# Patient Record
Sex: Male | Born: 1965 | Race: White | Hispanic: No | Marital: Married | Smoking: Former smoker
Health system: Southern US, Community
[De-identification: ages and names within clinical notes are randomized; demographics above are authoritative.]

## PROBLEM LIST (undated history)

## (undated) DIAGNOSIS — E7800 Pure hypercholesterolemia, unspecified: Secondary | ICD-10-CM

## (undated) DIAGNOSIS — E119 Type 2 diabetes mellitus without complications: Secondary | ICD-10-CM

## (undated) DIAGNOSIS — I10 Essential (primary) hypertension: Secondary | ICD-10-CM

## (undated) DIAGNOSIS — E669 Obesity, unspecified: Secondary | ICD-10-CM

## (undated) DIAGNOSIS — N289 Disorder of kidney and ureter, unspecified: Secondary | ICD-10-CM

## (undated) DIAGNOSIS — M549 Dorsalgia, unspecified: Secondary | ICD-10-CM

## (undated) HISTORY — PX: BACK SURGERY: SHX140

## (undated) HISTORY — PX: CHOLECYSTECTOMY: SHX55

## (undated) HISTORY — PX: OTHER SURGICAL HISTORY: SHX169

---

## 2018-11-29 ENCOUNTER — Emergency Department (HOSPITAL_BASED_OUTPATIENT_CLINIC_OR_DEPARTMENT_OTHER): Payer: BLUE CROSS/BLUE SHIELD

## 2018-11-29 ENCOUNTER — Emergency Department (HOSPITAL_BASED_OUTPATIENT_CLINIC_OR_DEPARTMENT_OTHER)
Admission: EM | Admit: 2018-11-29 | Discharge: 2018-11-29 | Disposition: A | Discharge status: Other Acute Inpatient Hospital | Payer: BLUE CROSS/BLUE SHIELD | Attending: Emergency Medicine | Admitting: Emergency Medicine

## 2018-11-29 ENCOUNTER — Other Ambulatory Visit: Payer: Self-pay

## 2018-11-29 ENCOUNTER — Encounter (HOSPITAL_BASED_OUTPATIENT_CLINIC_OR_DEPARTMENT_OTHER): Payer: Self-pay | Admitting: Emergency Medicine

## 2018-11-29 DIAGNOSIS — N179 Acute kidney failure, unspecified: Secondary | ICD-10-CM | POA: Diagnosis not present

## 2018-11-29 DIAGNOSIS — Z79899 Other long term (current) drug therapy: Secondary | ICD-10-CM | POA: Insufficient documentation

## 2018-11-29 DIAGNOSIS — E1122 Type 2 diabetes mellitus with diabetic chronic kidney disease: Secondary | ICD-10-CM | POA: Diagnosis not present

## 2018-11-29 DIAGNOSIS — I129 Hypertensive chronic kidney disease with stage 1 through stage 4 chronic kidney disease, or unspecified chronic kidney disease: Secondary | ICD-10-CM | POA: Insufficient documentation

## 2018-11-29 DIAGNOSIS — Z7902 Long term (current) use of antithrombotics/antiplatelets: Secondary | ICD-10-CM | POA: Insufficient documentation

## 2018-11-29 DIAGNOSIS — N184 Chronic kidney disease, stage 4 (severe): Secondary | ICD-10-CM | POA: Diagnosis not present

## 2018-11-29 DIAGNOSIS — Z7982 Long term (current) use of aspirin: Secondary | ICD-10-CM | POA: Insufficient documentation

## 2018-11-29 DIAGNOSIS — Z880 Allergy status to penicillin: Secondary | ICD-10-CM | POA: Diagnosis not present

## 2018-11-29 DIAGNOSIS — R109 Unspecified abdominal pain: Secondary | ICD-10-CM | POA: Diagnosis present

## 2018-11-29 DIAGNOSIS — Z87891 Personal history of nicotine dependence: Secondary | ICD-10-CM | POA: Diagnosis not present

## 2018-11-29 HISTORY — DX: Dorsalgia, unspecified: M54.9

## 2018-11-29 HISTORY — DX: Pure hypercholesterolemia, unspecified: E78.00

## 2018-11-29 HISTORY — DX: Obesity, unspecified: E66.9

## 2018-11-29 HISTORY — DX: Disorder of kidney and ureter, unspecified: N28.9

## 2018-11-29 HISTORY — DX: Type 2 diabetes mellitus without complications: E11.9

## 2018-11-29 HISTORY — DX: Essential (primary) hypertension: I10

## 2018-11-29 LAB — CBC WITH DIFFERENTIAL/PLATELET
Abs Immature Granulocytes: 0.02 10*3/uL (ref 0.00–0.07)
BASOS PCT: 1 %
Basophils Absolute: 0.1 10*3/uL (ref 0.0–0.1)
Eosinophils Absolute: 0.2 10*3/uL (ref 0.0–0.5)
Eosinophils Relative: 4 %
HCT: 34.9 % — ABNORMAL LOW (ref 39.0–52.0)
Hemoglobin: 11.3 g/dL — ABNORMAL LOW (ref 13.0–17.0)
Immature Granulocytes: 0 %
Lymphocytes Relative: 26 %
Lymphs Abs: 1.2 10*3/uL (ref 0.7–4.0)
MCH: 32 pg (ref 26.0–34.0)
MCHC: 32.4 g/dL (ref 30.0–36.0)
MCV: 98.9 fL (ref 80.0–100.0)
MONOS PCT: 8 %
Monocytes Absolute: 0.4 10*3/uL (ref 0.1–1.0)
Neutro Abs: 2.8 10*3/uL (ref 1.7–7.7)
Neutrophils Relative %: 61 %
PLATELETS: 198 10*3/uL (ref 150–400)
RBC: 3.53 MIL/uL — ABNORMAL LOW (ref 4.22–5.81)
RDW: 14.1 % (ref 11.5–15.5)
WBC: 4.6 10*3/uL (ref 4.0–10.5)
nRBC: 0 % (ref 0.0–0.2)

## 2018-11-29 LAB — BASIC METABOLIC PANEL
ANION GAP: 9 (ref 5–15)
BUN: 34 mg/dL — ABNORMAL HIGH (ref 6–20)
CALCIUM: 8.2 mg/dL — AB (ref 8.9–10.3)
CO2: 21 mmol/L — ABNORMAL LOW (ref 22–32)
Chloride: 102 mmol/L (ref 98–111)
Creatinine, Ser: 3.09 mg/dL — ABNORMAL HIGH (ref 0.61–1.24)
GFR calc Af Amer: 25 mL/min — ABNORMAL LOW (ref >60)
GFR calc non Af Amer: 22 mL/min — ABNORMAL LOW (ref >60)
Glucose, Bld: 241 mg/dL — ABNORMAL HIGH (ref 70–99)
Potassium: 3.9 mmol/L (ref 3.5–5.1)
Sodium: 132 mmol/L — ABNORMAL LOW (ref 135–145)

## 2018-11-29 LAB — COMPREHENSIVE METABOLIC PANEL
ALT: 64 U/L — ABNORMAL HIGH (ref 0–44)
AST: 50 U/L — ABNORMAL HIGH (ref 15–41)
Albumin: 3.5 g/dL (ref 3.5–5.0)
Alkaline Phosphatase: 141 U/L — ABNORMAL HIGH (ref 38–126)
Anion gap: 11 (ref 5–15)
BUN: 37 mg/dL — ABNORMAL HIGH (ref 6–20)
CO2: 22 mmol/L (ref 22–32)
Calcium: 8.9 mg/dL (ref 8.9–10.3)
Chloride: 100 mmol/L (ref 98–111)
Creatinine, Ser: 3.23 mg/dL — ABNORMAL HIGH (ref 0.61–1.24)
GFR calc non Af Amer: 21 mL/min — ABNORMAL LOW (ref >60)
GFR, EST AFRICAN AMERICAN: 24 mL/min — AB (ref >60)
Glucose, Bld: 372 mg/dL — ABNORMAL HIGH (ref 70–99)
Potassium: 4.8 mmol/L (ref 3.5–5.1)
Sodium: 133 mmol/L — ABNORMAL LOW (ref 135–145)
Total Bilirubin: 0.6 mg/dL (ref 0.3–1.2)
Total Protein: 7.2 g/dL (ref 6.5–8.1)

## 2018-11-29 LAB — URINALYSIS, MICROSCOPIC (REFLEX)

## 2018-11-29 LAB — URINALYSIS, ROUTINE W REFLEX MICROSCOPIC
BILIRUBIN URINE: NEGATIVE
Glucose, UA: 100 mg/dL — AB
Ketones, ur: NEGATIVE mg/dL
Leukocytes,Ua: NEGATIVE
Nitrite: NEGATIVE
Protein, ur: 100 mg/dL — AB
Specific Gravity, Urine: 1.01 (ref 1.005–1.030)
pH: 6 (ref 5.0–8.0)

## 2018-11-29 LAB — CK: Total CK: 21 U/L — ABNORMAL LOW (ref 49–397)

## 2018-11-29 MED ORDER — SODIUM CHLORIDE 0.9 % IV BOLUS
1000.0000 mL | Freq: Once | INTRAVENOUS | Status: AC
  Administered 2018-11-29: 1000 mL via INTRAVENOUS

## 2018-11-29 NOTE — ED Provider Notes (Signed)
[facility] EMERGENCY DEPARTMENT Provider Note   CSN: [id number] Arrival date & time: 11/29/18  1346    History   Chief Complaint Chief Complaint  Patient presents with  . Sent by pmd    HPI Jamie Pittman is a 52 y.o. male.     Patient recently evaluated at PCP office for chest tightness, occasional shortness of breath not related to activity, diffuse abdominal discomfort with occasional nausea. Emesis and diarrhea x 1 last week. No fevers/chills. Patient contacted yesterday by PCP with concern for abnormal lab values. History of CKD, DM, HTN. Reports blood sugar is fairly well controlled. Labs reviewed, mildly increased kidney functions. Liver enzymes noted to have increased between visit on 11/25/18 and 11/28/18. His gallbladder has been removed. Recent chest xray without acute abnormality. Flu A/B negative. Troponin on 11/25/18 within normal limits.   The history is provided by the patient and medical records. No language interpreter was used.  Abdominal Pain  Pain location:  Generalized Pain quality: gnawing   Pain radiates to:  Does not radiate Pain severity:  Mild Onset quality:  Gradual Timing:  Intermittent Progression:  Waxing and waning Chronicity:  New Associated symptoms: fatigue and nausea   Associated symptoms: no constipation     Past Medical History:  Diagnosis Date  . Back pain   . Diabetes mellitus without complication ([place])   . High cholesterol   . Hypertension   . Obesity   . Renal disorder     There are no active problems to display for this patient.   Past Surgical History:  Procedure Laterality Date  . BACK SURGERY    . CHOLECYSTECTOMY          Home Medications    Prior to Admission medications   Medication Sig Start Date End Date Taking? Authorizing Provider  aspirin EC 81 MG tablet Take by mouth. 02/17/17  Yes [provider]  ezetimibe (ZETIA) 10 MG tablet TAKE ONE (1) TABLET BY MOUTH EVERY DAY 10/12/18  Yes  [provider]  famotidine (PEPCID) 20 MG tablet Take by mouth. 11/25/18 12/25/18 Yes [provider]  metoprolol succinate (TOPROL-XL) 50 MG 24 hr tablet TAKE 1 AND 1/2 TABLETS BY MOUTH EVERY DAY 03/31/17  Yes [provider]  pantoprazole (PROTONIX) 40 MG tablet Take by mouth. 10/12/18 04/10/19 Yes [provider]  rosuvastatin (CRESTOR) 40 MG tablet TAKE ONE (1) TABLET BY MOUTH EVERY DAY 03/31/17  Yes [provider]  amLODipine (NORVASC) 10 MG tablet  11/15/18   [provider]  clopidogrel (PLAVIX) 75 MG tablet  11/01/18   [provider]  pioglitazone (ACTOS) 15 MG tablet  11/14/18   [provider]  [place] [phone] Base) MCG/ACT AEPB  11/24/18   [provider]    Family History No family history on file.  Social History Social History   Tobacco Use  . Smoking status: Former Research scientist (life sciences)  . Smokeless tobacco: Never Used  Substance Use Topics  . Alcohol use: Yes    Alcohol/week: 1.0 standard drinks    Types: 1 Shots of liquor per week    Comment: every other day  . Drug use: Never     Allergies   Penicillins   Review of Systems Review of Systems  Constitutional: Positive for fatigue.  Respiratory: Positive for chest tightness.   Gastrointestinal: Positive for abdominal pain and nausea. Negative for constipation.  All other systems reviewed and are negative.    Physical Exam Updated Vital  Signs BP 135/70 (BP Location: Right Arm)   Pulse 73   Temp 97.9 F (36.6 C) (Oral)   Resp 20   Ht 6\' 1"  (1.854 m)   Wt 124.7 kg   SpO2 100%   BMI 36.28 kg/m   Physical Exam Constitutional:      Appearance: Normal appearance.  HENT:     Head: Normocephalic.  Eyes:     Conjunctiva/sclera: Conjunctivae normal.  Cardiovascular:     Rate and Rhythm: Normal rate and regular rhythm.  Pulmonary:     Effort: Pulmonary effort is normal.     Breath sounds: Normal breath sounds.  Abdominal:      General: Bowel sounds are normal. There is no distension.     Palpations: Abdomen is soft.     Tenderness: There is no abdominal tenderness.  Musculoskeletal:     Right lower leg: No edema.     Left lower leg: No edema.  Skin:    General: Skin is warm and dry.     Coloration: Skin is pale.  Neurological:     Mental Status: He is alert and oriented to person, place, and time.  Psychiatric:        Mood and Affect: Mood normal.      ED Treatments / Results  Labs (all labs ordered are listed, but only abnormal results are displayed) Labs Reviewed  CBC WITH DIFFERENTIAL/PLATELET - Abnormal; Notable for the following components:      Result Value   RBC 3.53 (*)    Hemoglobin 11.3 (*)    HCT 34.9 (*)    All other components within normal limits  COMPREHENSIVE METABOLIC PANEL - Abnormal; Notable for the following components:   Sodium 133 (*)    Glucose, Bld 372 (*)    BUN 37 (*)    Creatinine, Ser 3.23 (*)    AST 50 (*)    ALT 64 (*)    Alkaline Phosphatase 141 (*)    GFR calc non Af Amer 21 (*)    GFR calc Af Amer 24 (*)    All other components within normal limits  URINALYSIS, ROUTINE W REFLEX MICROSCOPIC - Abnormal; Notable for the following components:   APPearance HAZY (*)    Glucose, UA 100 (*)    Hgb urine dipstick MODERATE (*)    Protein, ur 100 (*)    All other components within normal limits  URINALYSIS, MICROSCOPIC (REFLEX) - Abnormal; Notable for the following components:   Bacteria, UA MANY (*)    All other components within normal limits  BASIC METABOLIC PANEL - Abnormal; Notable for the following components:   Sodium 132 (*)    CO2 21 (*)    Glucose, Bld 241 (*)    BUN 34 (*)    Creatinine, Ser 3.09 (*)    Calcium 8.2 (*)    GFR calc non Af Amer 22 (*)    GFR calc Af Amer 25 (*)    All other components within normal limits  CK - Abnormal; Notable for the following components:   Total CK 21 (*)    All other components within normal limits    EKG  None  Radiology Ct Abdomen Pelvis Wo Contrast  Result Date: 11/29/2018 CLINICAL DATA:  Acute generalized abdominal pain. EXAM: CT ABDOMEN AND PELVIS WITHOUT CONTRAST TECHNIQUE: Multidetector CT imaging of the abdomen and pelvis was performed following the standard protocol without IV contrast. COMPARISON:  None. FINDINGS: Lower chest: No acute abnormality. Hepatobiliary: No focal  liver abnormality is seen. Status post cholecystectomy. No biliary dilatation. Pancreas: Unremarkable. No pancreatic ductal dilatation or surrounding inflammatory changes. Spleen: Normal in size without focal abnormality. Adrenals/Urinary Tract: Adrenal glands are unremarkable. Kidneys are normal, without renal calculi, focal lesion, or hydronephrosis. Bladder is unremarkable. Stomach/Bowel: Stomach is within normal limits. Appendix appears normal. No evidence of bowel wall thickening, distention, or inflammatory changes. Vascular/Lymphatic: Aortic atherosclerosis. No enlarged abdominal or pelvic lymph nodes. Reproductive: Prostate is unremarkable. Other: No abdominal wall hernia or abnormality. No abdominopelvic ascites. Musculoskeletal: No acute or significant osseous findings. IMPRESSION: No acute abnormality seen in the abdomen or pelvis. Electronically Signed   By: Marijo Conception, M.D.   On: 11/29/2018 16:59    Procedures Procedures (including critical care time)  Medications Ordered in ED Medications - No data to display   Initial Impression / Assessment and Plan / ED Course  I have reviewed the triage vital signs and the nursing notes.  Pertinent labs & imaging results that were available during my care of the patient were reviewed by me and considered in my medical decision making (see chart for details).       Patient with acute on chronic renal failure, unknown etiology. Admission last month for same, modification of medication regimen was made at that time, including cessation of metformin and rampiril.    Fluid bolus given in ED, with minimal improvement in BUN/Creatinine. GFR 21. Patient discussed with Dr. Rex Kras. Will request admission at [facility]. Patient accepted for admission by Dwaine Deter, MD.   Final Clinical Impressions(s) / ED Diagnoses   Final diagnoses:  Acute renal failure superimposed on stage 4 chronic kidney disease, unspecified acute renal failure type [facility])    ED Discharge Orders    None       Etta Quill, NP 11/29/18 2043    Sherwood Gambler, MD 12/02/18 1050

## 2018-11-29 NOTE — ED Triage Notes (Signed)
Went to pmd for lethargy, abd pain, and SOB on Friday (4 days ago).  Labs showed increased "kidney labs."  They repeated the tests yesterday and the values were higher so they suggested he come to the ED for eval.  Still has abd pain, "queezy", fatigue, and nausea.

## 2019-02-06 ENCOUNTER — Encounter (HOSPITAL_BASED_OUTPATIENT_CLINIC_OR_DEPARTMENT_OTHER): Payer: Self-pay

## 2019-02-06 ENCOUNTER — Other Ambulatory Visit: Payer: Self-pay

## 2019-02-06 ENCOUNTER — Emergency Department (HOSPITAL_BASED_OUTPATIENT_CLINIC_OR_DEPARTMENT_OTHER)
Admission: EM | Admit: 2019-02-06 | Discharge: 2019-02-06 | Disposition: A | Discharge status: Home / Self Care | Payer: BC Managed Care – PPO | Attending: Emergency Medicine | Admitting: Emergency Medicine

## 2019-02-06 DIAGNOSIS — I10 Essential (primary) hypertension: Secondary | ICD-10-CM | POA: Insufficient documentation

## 2019-02-06 DIAGNOSIS — R531 Weakness: Secondary | ICD-10-CM

## 2019-02-06 DIAGNOSIS — Z79899 Other long term (current) drug therapy: Secondary | ICD-10-CM | POA: Diagnosis not present

## 2019-02-06 DIAGNOSIS — M6281 Muscle weakness (generalized): Secondary | ICD-10-CM | POA: Insufficient documentation

## 2019-02-06 DIAGNOSIS — E1165 Type 2 diabetes mellitus with hyperglycemia: Secondary | ICD-10-CM | POA: Insufficient documentation

## 2019-02-06 DIAGNOSIS — Z87891 Personal history of nicotine dependence: Secondary | ICD-10-CM | POA: Diagnosis not present

## 2019-02-06 DIAGNOSIS — Z7982 Long term (current) use of aspirin: Secondary | ICD-10-CM | POA: Insufficient documentation

## 2019-02-06 DIAGNOSIS — R739 Hyperglycemia, unspecified: Secondary | ICD-10-CM

## 2019-02-06 LAB — COMPREHENSIVE METABOLIC PANEL
ALT: 25 U/L (ref 0–44)
AST: 24 U/L (ref 15–41)
Albumin: 3.2 g/dL — ABNORMAL LOW (ref 3.5–5.0)
Alkaline Phosphatase: 113 U/L (ref 38–126)
Anion gap: 10 (ref 5–15)
BUN: 20 mg/dL (ref 6–20)
CO2: 23 mmol/L (ref 22–32)
Calcium: 8.7 mg/dL — ABNORMAL LOW (ref 8.9–10.3)
Chloride: 98 mmol/L (ref 98–111)
Creatinine, Ser: 1.48 mg/dL — ABNORMAL HIGH (ref 0.61–1.24)
GFR calc Af Amer: >60 mL/min (ref >60)
GFR calc non Af Amer: 53 mL/min — ABNORMAL LOW (ref >60)
Glucose, Bld: 428 mg/dL — ABNORMAL HIGH (ref 70–99)
Potassium: 4.4 mmol/L (ref 3.5–5.1)
Sodium: 131 mmol/L — ABNORMAL LOW (ref 135–145)
Total Bilirubin: 0.5 mg/dL (ref 0.3–1.2)
Total Protein: 6.4 g/dL — ABNORMAL LOW (ref 6.5–8.1)

## 2019-02-06 LAB — URINALYSIS, ROUTINE W REFLEX MICROSCOPIC
Bilirubin Urine: NEGATIVE
Glucose, UA: >=500 mg/dL — AB
Ketones, ur: NEGATIVE mg/dL
Leukocytes,Ua: NEGATIVE
Nitrite: NEGATIVE
Protein, ur: 100 mg/dL — AB
Specific Gravity, Urine: 1.01 (ref 1.005–1.030)
pH: 5.5 (ref 5.0–8.0)

## 2019-02-06 LAB — POCT I-STAT EG7
Bicarbonate: 25.9 mmol/L (ref 20.0–28.0)
Calcium, Ion: 1.16 mmol/L (ref 1.15–1.40)
HCT: 36 % — ABNORMAL LOW (ref 39.0–52.0)
Hemoglobin: 12.2 g/dL — ABNORMAL LOW (ref 13.0–17.0)
O2 Saturation: 76 %
Patient temperature: 97.8
Potassium: 4.5 mmol/L (ref 3.5–5.1)
Sodium: 132 mmol/L — ABNORMAL LOW (ref 135–145)
TCO2: 27 mmol/L (ref 22–32)
pCO2, Ven: 45.2 mmHg (ref 44.0–60.0)
pH, Ven: 7.365 (ref 7.250–7.430)
pO2, Ven: 42 mmHg (ref 32.0–45.0)

## 2019-02-06 LAB — CBC WITH DIFFERENTIAL/PLATELET
Abs Immature Granulocytes: 0.02 10*3/uL (ref 0.00–0.07)
Basophils Absolute: 0.1 10*3/uL (ref 0.0–0.1)
Basophils Relative: 1 %
Eosinophils Absolute: 0.2 10*3/uL (ref 0.0–0.5)
Eosinophils Relative: 4 %
HCT: 37.6 % — ABNORMAL LOW (ref 39.0–52.0)
Hemoglobin: 12.2 g/dL — ABNORMAL LOW (ref 13.0–17.0)
Immature Granulocytes: 0 %
Lymphocytes Relative: 17 %
Lymphs Abs: 0.8 10*3/uL (ref 0.7–4.0)
MCH: 31.8 pg (ref 26.0–34.0)
MCHC: 32.4 g/dL (ref 30.0–36.0)
MCV: 97.9 fL (ref 80.0–100.0)
Monocytes Absolute: 0.5 10*3/uL (ref 0.1–1.0)
Monocytes Relative: 9 %
Neutro Abs: 3.5 10*3/uL (ref 1.7–7.7)
Neutrophils Relative %: 69 %
Platelets: 189 10*3/uL (ref 150–400)
RBC: 3.84 MIL/uL — ABNORMAL LOW (ref 4.22–5.81)
RDW: 13.5 % (ref 11.5–15.5)
WBC: 5.1 10*3/uL (ref 4.0–10.5)
nRBC: 0 % (ref 0.0–0.2)

## 2019-02-06 LAB — CBG MONITORING, ED
Glucose-Capillary: 408 mg/dL — ABNORMAL HIGH (ref 70–99)
Glucose-Capillary: 300 mg/dL — ABNORMAL HIGH (ref 70–99)

## 2019-02-06 LAB — URINALYSIS, MICROSCOPIC (REFLEX)

## 2019-02-06 MED ORDER — SODIUM CHLORIDE 0.9 % IV BOLUS
1000.0000 mL | Freq: Once | INTRAVENOUS | Status: AC
  Administered 2019-02-06: 1000 mL via INTRAVENOUS

## 2019-02-06 MED ORDER — INSULIN ASPART 100 UNIT/ML ~~LOC~~ SOLN
10.0000 [IU] | Freq: Once | SUBCUTANEOUS | Status: AC
  Administered 2019-02-06: 10 [IU] via INTRAVENOUS
  Filled 2019-02-06: qty 1

## 2019-02-06 NOTE — ED Triage Notes (Signed)
Pt states new change in diabetes medication to Januvia in April, for past week feeling more weak,  Checked finger stick yesterday 500s, has been getting up to 600.  Today 408.  Pt states took extra metformin Friday and Saturday without any improvement

## 2019-02-06 NOTE — Discharge Instructions (Signed)
You may increased your Jardiance (empagliflozin) to 20mg  from 10mg .  (take 2 tablets instead of one each morning.) Discuss with your physician.

## 2019-02-06 NOTE — ED Provider Notes (Signed)
[facility] EMERGENCY DEPARTMENT Provider Note   CSN: [id number] Arrival date & time: 02/06/19  1018    History   Chief Complaint Chief Complaint  Patient presents with  . Weakness    HPI Jamie Pittman is a 53 y.o. male.     HPI   Presents with generalized weakness and hyperglycemia About 1-2 weeks, worse over last 4-5 days Glucose over 600, tried calling PCP but she is out of town Took metformin went from 600s to 400s, took 2 of 500mg  Friday, 2 Saturday Loose stool after the metformin Has not been eating much Urinary frequency, no dysuria Feels weak and foggy headed, mild headache No focal numbness and weakness No n/v/fevers/cough/CP Does have some dyspnea on exertion  Jardiance was started in April Has been eating a lot of fruit recently  Past Medical History:  Diagnosis Date  . Back pain   . Diabetes mellitus without complication ([place])   . High cholesterol   . Hypertension   . Obesity   . Renal disorder     There are no active problems to display for this patient.   Past Surgical History:  Procedure Laterality Date  . BACK SURGERY    . CHOLECYSTECTOMY          Home Medications    Prior to Admission medications   Medication Sig Start Date End Date Taking? Authorizing Provider  amLODipine (NORVASC) 10 MG tablet  11/15/18   [provider]  aspirin EC 81 MG tablet Take by mouth. 02/17/17   [provider]  clopidogrel (PLAVIX) 75 MG tablet  11/01/18   [provider]  ezetimibe (ZETIA) 10 MG tablet TAKE ONE (1) TABLET BY MOUTH EVERY DAY 10/12/18   [provider]  famotidine (PEPCID) 20 MG tablet Take by mouth. 11/25/18 12/25/18  [provider]  metoprolol succinate (TOPROL-XL) 50 MG 24 hr tablet TAKE 1 AND 1/2 TABLETS BY MOUTH EVERY DAY 03/31/17   [provider]  pantoprazole (PROTONIX) 40 MG tablet Take by mouth. 10/12/18 04/10/19  [provider]  pioglitazone (ACTOS) 15 MG tablet   11/14/18   [provider]  [place] [phone] Base) MCG/ACT AEPB  11/24/18   [provider]  rosuvastatin (CRESTOR) 40 MG tablet TAKE ONE (1) TABLET BY MOUTH EVERY DAY 03/31/17   [provider]    Family History History reviewed. No pertinent family history.  Social History Social History   Tobacco Use  . Smoking status: Former Research scientist (life sciences)  . Smokeless tobacco: Never Used  Substance Use Topics  . Alcohol use: Yes    Alcohol/week: 1.0 standard drinks    Types: 1 Shots of liquor per week    Comment: every other day  . Drug use: Never     Allergies   Penicillins   Review of Systems Review of Systems  Constitutional: Positive for fatigue. Negative for fever.  HENT: Negative for sore throat.   Eyes: Negative for visual disturbance.  Respiratory: Negative for shortness of breath.   Cardiovascular: Negative for chest pain.  Gastrointestinal: Negative for abdominal pain, nausea and vomiting.  Endocrine: Positive for polydipsia and polyuria.  Genitourinary: Positive for frequency. Negative for difficulty urinating and dysuria.  Musculoskeletal: Negative for back pain and neck stiffness.  Skin: Negative for rash.  Neurological: Negative for syncope and headaches.     Physical Exam Updated Vital Signs BP (!) 161/78 (BP Location: Left Arm)   Pulse 70   Temp 97.8 F (36.6 C) (Oral)  Resp 16   Ht 6\' 1"  (1.854 m)   Wt 124.7 kg   SpO2 99%   BMI 36.28 kg/m   Physical Exam Vitals signs and nursing note reviewed.  Constitutional:      General: He is not in acute distress.    Appearance: He is well-developed. He is not diaphoretic.  HENT:     Head: Normocephalic and atraumatic.  Eyes:     Conjunctiva/sclera: Conjunctivae normal.  Neck:     Musculoskeletal: Normal range of motion.  Cardiovascular:     Rate and Rhythm: Normal rate and regular rhythm.  Pulmonary:     Effort: Pulmonary effort is normal. No respiratory distress.  Abdominal:      General: There is no distension.     Palpations: Abdomen is soft.     Tenderness: There is no abdominal tenderness. There is no guarding.  Skin:    General: Skin is warm and dry.  Neurological:     Mental Status: He is alert and oriented to person, place, and time.      ED Treatments / Results  Labs (all labs ordered are listed, but only abnormal results are displayed) Labs Reviewed  CBC WITH DIFFERENTIAL/PLATELET - Abnormal; Notable for the following components:      Result Value   RBC 3.84 (*)    Hemoglobin 12.2 (*)    HCT 37.6 (*)    All other components within normal limits  COMPREHENSIVE METABOLIC PANEL - Abnormal; Notable for the following components:   Sodium 131 (*)    Glucose, Bld 428 (*)    Creatinine, Ser 1.48 (*)    Calcium 8.7 (*)    Total Protein 6.4 (*)    Albumin 3.2 (*)    GFR calc non Af Amer 53 (*)    All other components within normal limits  URINALYSIS, ROUTINE W REFLEX MICROSCOPIC - Abnormal; Notable for the following components:   Glucose, UA >=500 (*)    Hgb urine dipstick TRACE (*)    Protein, ur 100 (*)    All other components within normal limits  URINALYSIS, MICROSCOPIC (REFLEX) - Abnormal; Notable for the following components:   Bacteria, UA RARE (*)    All other components within normal limits  CBG MONITORING, ED - Abnormal; Notable for the following components:   Glucose-Capillary 408 (*)    All other components within normal limits  POCT I-STAT EG7 - Abnormal; Notable for the following components:   Sodium 132 (*)    HCT 36.0 (*)    Hemoglobin 12.2 (*)    All other components within normal limits  CBG MONITORING, ED - Abnormal; Notable for the following components:   Glucose-Capillary 300 (*)    All other components within normal limits  URINE CULTURE    EKG None  Radiology No results found.  Procedures Procedures (including critical care time)  Medications Ordered in ED Medications  sodium chloride 0.9 % bolus 1,000  mL (0 mLs Intravenous Stopped 02/06/19 1152)  insulin aspart (novoLOG) injection 10 Units (10 Units Intravenous Given 02/06/19 1218)     Initial Impression / Assessment and Plan / ED Course  I have reviewed the triage vital signs and the nursing notes.  Pertinent labs & imaging results that were available during my care of the patient were reviewed by me and considered in my medical decision making (see chart for details).        53 year old male with a history of diabetes, hypertension, hyperlipidemia, obesity presents with concern  for generalized weakness and hyperglycemia.  Labs show hyperglycemia without evidence of DKA.  No significant anemia or other electrolyte abnormality.  No chest pain or shortness of breath.  Denies any other infectious symptoms.  Suspect that his fatigue is likely secondary to dehydration in the setting of hyperglycemia.  Hyperglycemia likely secondary to his medication change as well as change in diet with recently eating more fruit.  We will increase his Jardiance from 10 mg daily to 20 mg and recommend very close discussion with his primary care physician.  Reports his PCP will be back on Wednesday and he will contact her. Patient discharged in stable condition with understanding of reasons to return.   Final Clinical Impressions(s) / ED Diagnoses   Final diagnoses:  Generalized weakness  Hyperglycemia    ED Discharge Orders    None       Gareth Morgan, MD 02/07/19 [phone]

## 2019-02-06 NOTE — ED Notes (Signed)
Pt made aware of need for urine specimen, provided urinal, agrees to provided specimen as soon as he is able.

## 2019-02-07 LAB — URINE CULTURE: Culture: NO GROWTH

## 2020-08-31 ENCOUNTER — Emergency Department (HOSPITAL_BASED_OUTPATIENT_CLINIC_OR_DEPARTMENT_OTHER): Payer: BC Managed Care – PPO

## 2020-08-31 ENCOUNTER — Inpatient Hospital Stay (HOSPITAL_BASED_OUTPATIENT_CLINIC_OR_DEPARTMENT_OTHER)
Admission: EM | Admit: 2020-08-31 | Discharge: 2020-09-03 | DRG: 177 | Disposition: A | Discharge status: Home / Self Care | Payer: BC Managed Care – PPO | Attending: Internal Medicine | Admitting: Internal Medicine

## 2020-08-31 ENCOUNTER — Other Ambulatory Visit: Payer: Self-pay

## 2020-08-31 ENCOUNTER — Encounter (HOSPITAL_BASED_OUTPATIENT_CLINIC_OR_DEPARTMENT_OTHER): Payer: Self-pay | Admitting: Emergency Medicine

## 2020-08-31 DIAGNOSIS — M549 Dorsalgia, unspecified: Secondary | ICD-10-CM

## 2020-08-31 DIAGNOSIS — Z87891 Personal history of nicotine dependence: Secondary | ICD-10-CM

## 2020-08-31 DIAGNOSIS — U071 COVID-19: Principal | ICD-10-CM

## 2020-08-31 DIAGNOSIS — J1282 Pneumonia due to coronavirus disease 2019: Secondary | ICD-10-CM | POA: Diagnosis present

## 2020-08-31 DIAGNOSIS — Z7901 Long term (current) use of anticoagulants: Secondary | ICD-10-CM

## 2020-08-31 DIAGNOSIS — E1122 Type 2 diabetes mellitus with diabetic chronic kidney disease: Secondary | ICD-10-CM | POA: Diagnosis present

## 2020-08-31 DIAGNOSIS — N184 Chronic kidney disease, stage 4 (severe): Secondary | ICD-10-CM | POA: Diagnosis present

## 2020-08-31 DIAGNOSIS — R0902 Hypoxemia: Secondary | ICD-10-CM

## 2020-08-31 DIAGNOSIS — E1151 Type 2 diabetes mellitus with diabetic peripheral angiopathy without gangrene: Secondary | ICD-10-CM | POA: Diagnosis present

## 2020-08-31 DIAGNOSIS — R112 Nausea with vomiting, unspecified: Secondary | ICD-10-CM

## 2020-08-31 DIAGNOSIS — Z79899 Other long term (current) drug therapy: Secondary | ICD-10-CM

## 2020-08-31 DIAGNOSIS — J9601 Acute respiratory failure with hypoxia: Secondary | ICD-10-CM | POA: Diagnosis present

## 2020-08-31 DIAGNOSIS — D631 Anemia in chronic kidney disease: Secondary | ICD-10-CM | POA: Diagnosis present

## 2020-08-31 DIAGNOSIS — Z880 Allergy status to penicillin: Secondary | ICD-10-CM

## 2020-08-31 DIAGNOSIS — I10 Essential (primary) hypertension: Secondary | ICD-10-CM | POA: Diagnosis present

## 2020-08-31 DIAGNOSIS — J9600 Acute respiratory failure, unspecified whether with hypoxia or hypercapnia: Secondary | ICD-10-CM | POA: Diagnosis present

## 2020-08-31 DIAGNOSIS — Z9049 Acquired absence of other specified parts of digestive tract: Secondary | ICD-10-CM

## 2020-08-31 DIAGNOSIS — E1159 Type 2 diabetes mellitus with other circulatory complications: Secondary | ICD-10-CM

## 2020-08-31 DIAGNOSIS — Z7984 Long term (current) use of oral hypoglycemic drugs: Secondary | ICD-10-CM

## 2020-08-31 DIAGNOSIS — I129 Hypertensive chronic kidney disease with stage 1 through stage 4 chronic kidney disease, or unspecified chronic kidney disease: Secondary | ICD-10-CM | POA: Diagnosis present

## 2020-08-31 DIAGNOSIS — I480 Paroxysmal atrial fibrillation: Secondary | ICD-10-CM | POA: Diagnosis present

## 2020-08-31 DIAGNOSIS — M542 Cervicalgia: Secondary | ICD-10-CM | POA: Diagnosis not present

## 2020-08-31 DIAGNOSIS — E7800 Pure hypercholesterolemia, unspecified: Secondary | ICD-10-CM | POA: Diagnosis present

## 2020-08-31 DIAGNOSIS — R000 Tachycardia, unspecified: Secondary | ICD-10-CM | POA: Diagnosis present

## 2020-08-31 DIAGNOSIS — M48061 Spinal stenosis, lumbar region without neurogenic claudication: Secondary | ICD-10-CM | POA: Diagnosis present

## 2020-08-31 DIAGNOSIS — R0600 Dyspnea, unspecified: Secondary | ICD-10-CM

## 2020-08-31 DIAGNOSIS — Z7982 Long term (current) use of aspirin: Secondary | ICD-10-CM

## 2020-08-31 DIAGNOSIS — Z833 Family history of diabetes mellitus: Secondary | ICD-10-CM

## 2020-08-31 DIAGNOSIS — N189 Chronic kidney disease, unspecified: Secondary | ICD-10-CM

## 2020-08-31 DIAGNOSIS — N179 Acute kidney failure, unspecified: Secondary | ICD-10-CM | POA: Diagnosis present

## 2020-08-31 LAB — COMPREHENSIVE METABOLIC PANEL
ALT: 21 U/L (ref 0–44)
AST: 65 U/L — ABNORMAL HIGH (ref 15–41)
Albumin: 2.4 g/dL — ABNORMAL LOW (ref 3.5–5.0)
Alkaline Phosphatase: 156 U/L — ABNORMAL HIGH (ref 38–126)
Anion gap: 12 (ref 5–15)
BUN: 34 mg/dL — ABNORMAL HIGH (ref 6–20)
CO2: 30 mmol/L (ref 22–32)
Calcium: 8 mg/dL — ABNORMAL LOW (ref 8.9–10.3)
Chloride: 92 mmol/L — ABNORMAL LOW (ref 98–111)
Creatinine, Ser: 4.18 mg/dL — ABNORMAL HIGH (ref 0.61–1.24)
GFR, Estimated: 16 mL/min — ABNORMAL LOW (ref >60)
Glucose, Bld: 158 mg/dL — ABNORMAL HIGH (ref 70–99)
Potassium: 3.8 mmol/L (ref 3.5–5.1)
Sodium: 134 mmol/L — ABNORMAL LOW (ref 135–145)
Total Bilirubin: 0.3 mg/dL (ref 0.3–1.2)
Total Protein: 5.8 g/dL — ABNORMAL LOW (ref 6.5–8.1)

## 2020-08-31 LAB — URINALYSIS, ROUTINE W REFLEX MICROSCOPIC
Bilirubin Urine: NEGATIVE
Glucose, UA: NEGATIVE mg/dL
Ketones, ur: NEGATIVE mg/dL
Leukocytes,Ua: NEGATIVE
Nitrite: NEGATIVE
Protein, ur: >300 mg/dL — AB
Specific Gravity, Urine: 1.01 (ref 1.005–1.030)
pH: 7.5 (ref 5.0–8.0)

## 2020-08-31 LAB — RESP PANEL BY RT-PCR (FLU A&B, COVID) ARPGX2
Influenza A by PCR: NEGATIVE
Influenza B by PCR: NEGATIVE
SARS Coronavirus 2 by RT PCR: POSITIVE — AB

## 2020-08-31 LAB — URINALYSIS, MICROSCOPIC (REFLEX)

## 2020-08-31 LAB — CBC
HCT: 25.8 % — ABNORMAL LOW (ref 39.0–52.0)
Hemoglobin: 8.7 g/dL — ABNORMAL LOW (ref 13.0–17.0)
MCH: 33.7 pg (ref 26.0–34.0)
MCHC: 33.7 g/dL (ref 30.0–36.0)
MCV: 100 fL (ref 80.0–100.0)
Platelets: 171 10*3/uL (ref 150–400)
RBC: 2.58 MIL/uL — ABNORMAL LOW (ref 4.22–5.81)
RDW: 13.2 % (ref 11.5–15.5)
WBC: 4.3 10*3/uL (ref 4.0–10.5)
nRBC: 0 % (ref 0.0–0.2)

## 2020-08-31 LAB — LIPASE, BLOOD: Lipase: 24 U/L (ref 11–51)

## 2020-08-31 LAB — D-DIMER, QUANTITATIVE: D-Dimer, Quant: 0.49 ug/mL-FEU (ref 0.00–0.50)

## 2020-08-31 LAB — LACTIC ACID, PLASMA: Lactic Acid, Venous: 1.1 mmol/L (ref 0.5–1.9)

## 2020-08-31 LAB — TROPONIN I (HIGH SENSITIVITY)
Troponin I (High Sensitivity): 41 ng/L — ABNORMAL HIGH (ref <18)
Troponin I (High Sensitivity): 45 ng/L — ABNORMAL HIGH (ref <18)

## 2020-08-31 MED ORDER — PRAVASTATIN SODIUM 20 MG PO TABS
40.0000 mg | ORAL_TABLET | Freq: Every day | ORAL | Status: DC
  Administered 2020-09-01 – 2020-09-02 (×2): 40 mg via ORAL
  Filled 2020-08-31 (×2): qty 1
  Filled 2020-08-31: qty 2

## 2020-08-31 MED ORDER — METOPROLOL TARTRATE 25 MG PO TABS
50.0000 mg | ORAL_TABLET | Freq: Once | ORAL | Status: AC
  Administered 2020-08-31: 50 mg via ORAL
  Filled 2020-08-31: qty 2

## 2020-08-31 MED ORDER — SODIUM CHLORIDE 0.9 % IV SOLN
100.0000 mg | Freq: Once | INTRAVENOUS | Status: AC
  Administered 2020-08-31: 100 mg via INTRAVENOUS

## 2020-08-31 MED ORDER — ONDANSETRON 4 MG PO TBDP: 4.0000 mg | ORAL_TABLET | Freq: Three times a day (TID) | ORAL | 0 refills | Status: AC | PRN

## 2020-08-31 MED ORDER — METFORMIN HCL 500 MG PO TABS
1000.0000 mg | ORAL_TABLET | Freq: Once | ORAL | Status: AC
  Administered 2020-08-31: 1000 mg via ORAL
  Filled 2020-08-31: qty 2

## 2020-08-31 MED ORDER — METHOCARBAMOL 500 MG PO TABS: 500.0000 mg | ORAL_TABLET | Freq: Three times a day (TID) | ORAL | 0 refills | Status: AC | PRN

## 2020-08-31 MED ORDER — ALBUTEROL SULFATE HFA 108 (90 BASE) MCG/ACT IN AERS
2.0000 | INHALATION_SPRAY | RESPIRATORY_TRACT | Status: DC | PRN
  Administered 2020-08-31: 2 via RESPIRATORY_TRACT
  Filled 2020-08-31: qty 6.7

## 2020-08-31 MED ORDER — SODIUM CHLORIDE 0.9 % IV SOLN
100.0000 mg | Freq: Every day | INTRAVENOUS | Status: DC
  Administered 2020-09-01 – 2020-09-03 (×3): 100 mg via INTRAVENOUS
  Filled 2020-08-31 (×2): qty 20

## 2020-08-31 MED ORDER — OXYCODONE-ACETAMINOPHEN 5-325 MG PO TABS
1.0000 | ORAL_TABLET | Freq: Once | ORAL | Status: AC
  Administered 2020-08-31: 1 via ORAL
  Filled 2020-08-31: qty 1

## 2020-08-31 MED ORDER — DEXAMETHASONE SODIUM PHOSPHATE 10 MG/ML IJ SOLN
6.0000 mg | INTRAMUSCULAR | Status: DC
  Administered 2020-08-31 – 2020-09-02 (×3): 6 mg via INTRAVENOUS
  Filled 2020-08-31 (×3): qty 1

## 2020-08-31 NOTE — ED Notes (Signed)
Pt ambulated with continuous pulse Ox , reading 91-93 %. Dr Alvino Chapel made aware,.

## 2020-08-31 NOTE — ED Notes (Signed)
EDP aware of +covid result.

## 2020-08-31 NOTE — ED Triage Notes (Addendum)
States he was told to come to the ED due to dehydration from vomiting. He went to [facility] hospital and [facility]. States he has back pain.

## 2020-08-31 NOTE — ED Provider Notes (Signed)
[facility] EMERGENCY DEPARTMENT Provider Note   CSN: [id number] Arrival date & time: 08/31/20  1410     History Chief Complaint  Patient presents with  . Back Pain  . Emesis    Jamie Pittman is a 55 y.o. male.  HPI Patient with few different complaints.  States that he has had a episode of vomiting today.  Has been feeling bad over the last few days.  History of stage IV renal disease.  It appears his baseline creatinine is around 4.4.  Seen by nephrology 2 days ago.  Had decrease in his fluid pill.  His weight is however up.  States he has more pain in his neck that is unrelieved by the pain medicine he is on.  No fevers.  Does have some shortness of breath.  States he feels as if he is caring more fluid.  No cough.  States however he does have shaking in his hands at times.  States it will spasm up.  Still able to move the arm when this happens.  States it will clear up after it.  It primarily happens in the left side but does have episodes in the right side also.  Does not have the episodes happen at the same time.  Has had some dull pain in his neck.  This is somewhat new for him.    Past Medical History:  Diagnosis Date  . Back pain   . Diabetes mellitus without complication ([place])   . High cholesterol   . Hypertension   . Obesity   . Renal disorder     There are no problems to display for this patient.   Past Surgical History:  Procedure Laterality Date  . BACK SURGERY    . CHOLECYSTECTOMY         No family history on file.  Social History   Tobacco Use  . Smoking status: Former Research scientist (life sciences)  . Smokeless tobacco: Never Used  Substance Use Topics  . Alcohol use: Yes    Alcohol/week: 1.0 standard drink    Types: 1 Shots of liquor per week    Comment: every other day  . Drug use: Never    Home Medications Prior to Admission medications   Medication Sig Start Date End Date Taking? Authorizing Provider  methocarbamol (ROBAXIN) 500 MG tablet Take 1  tablet (500 mg total) by mouth every 8 (eight) hours as needed for muscle spasms. 08/31/20  Yes Davonna Belling, MD  ondansetron (ZOFRAN-ODT) 4 MG disintegrating tablet Take 1 tablet (4 mg total) by mouth every 8 (eight) hours as needed for nausea or vomiting. 08/31/20  Yes Davonna Belling, MD  amLODipine (NORVASC) 10 MG tablet  11/15/18   [provider]  aspirin EC 81 MG tablet Take by mouth. 02/17/17   [provider]  clopidogrel (PLAVIX) 75 MG tablet  11/01/18   [provider]  ezetimibe (ZETIA) 10 MG tablet TAKE ONE (1) TABLET BY MOUTH EVERY DAY 10/12/18   [provider]  famotidine (PEPCID) 20 MG tablet Take by mouth. 11/25/18 12/25/18  [provider]  metoprolol succinate (TOPROL-XL) 50 MG 24 hr tablet TAKE 1 AND 1/2 TABLETS BY MOUTH EVERY DAY 03/31/17   [provider]  pantoprazole (PROTONIX) 40 MG tablet Take by mouth. 10/12/18 04/10/19  [provider]  pioglitazone (ACTOS) 15 MG tablet  11/14/18   [provider]  [place] 025 [phone] Base) MCG/ACT AEPB  11/24/18   [provider]  rosuvastatin (CRESTOR)  40 MG tablet TAKE ONE (1) TABLET BY MOUTH EVERY DAY 03/31/17   [provider]    Allergies    Penicillins  Review of Systems   Review of Systems  Constitutional: Negative for appetite change.  HENT: Negative for congestion.   Respiratory: Negative for shortness of breath.   Cardiovascular: Negative for chest pain.  Gastrointestinal: Negative for abdominal pain.  Genitourinary: Negative for flank pain.  Skin: Negative for rash.  Psychiatric/Behavioral: Negative for confusion.    Physical Exam Updated Vital Signs BP (!) 189/79   Pulse 98   Temp 98.6 F (37 C) (Oral)   Resp (!) 22   Ht 6\' 1"  (1.854 m)   Wt 117.9 kg   SpO2 96%   BMI 34.30 kg/m   Physical Exam Vitals reviewed.  HENT:     Head: Atraumatic.  Eyes:     Pupils: Pupils are equal, round, and reactive to light.   Cardiovascular:     Rate and Rhythm: Regular rhythm.  Pulmonary:     Comments: Harsh breath sounds bilaterally. Abdominal:     Tenderness: There is no abdominal tenderness.  Musculoskeletal:     Cervical back: Neck supple.  Skin:    General: Skin is warm.  Neurological:     General: No focal deficit present.     Mental Status: He is alert.     Comments: PatientAwake and appropriate.  Face symmetric.  Good strength bilaterally.  Good radial median and ulnar distributions of bilateral hands.     ED Results / Procedures / Treatments   Labs (all labs ordered are listed, but only abnormal results are displayed) Labs Reviewed  RESP PANEL BY RT-PCR (FLU A&B, COVID) ARPGX2 - Abnormal; Notable for the following components:      Result Value   SARS Coronavirus 2 by RT PCR POSITIVE (*)    All other components within normal limits  COMPREHENSIVE METABOLIC PANEL - Abnormal; Notable for the following components:   Sodium 134 (*)    Chloride 92 (*)    Glucose, Bld 158 (*)    BUN 34 (*)    Creatinine, Ser 4.18 (*)    Calcium 8.0 (*)    Total Protein 5.8 (*)    Albumin 2.4 (*)    AST 65 (*)    Alkaline Phosphatase 156 (*)    GFR, Estimated 16 (*)    All other components within normal limits  CBC - Abnormal; Notable for the following components:   RBC 2.58 (*)    Hemoglobin 8.7 (*)    HCT 25.8 (*)    All other components within normal limits  URINALYSIS, ROUTINE W REFLEX MICROSCOPIC - Abnormal; Notable for the following components:   Hgb urine dipstick TRACE (*)    Protein, ur >300 (*)    All other components within normal limits  URINALYSIS, MICROSCOPIC (REFLEX) - Abnormal; Notable for the following components:   Bacteria, UA FEW (*)    All other components within normal limits  TROPONIN I (HIGH SENSITIVITY) - Abnormal; Notable for the following components:   Troponin I (High Sensitivity) 41 (*)    All other components within normal limits  TROPONIN I (HIGH SENSITIVITY) -  Abnormal; Notable for the following components:   Troponin I (High Sensitivity) 45 (*)    All other components within normal limits  CULTURE, BLOOD (ROUTINE X 2)  CULTURE, BLOOD (ROUTINE X 2)  LIPASE, BLOOD  LACTIC ACID, PLASMA  LACTIC ACID, PLASMA  D-DIMER, QUANTITATIVE (NOT AT  [facility])  PROCALCITONIN  LACTATE DEHYDROGENASE  FERRITIN  TRIGLYCERIDES  FIBRINOGEN  C-REACTIVE PROTEIN    EKG EKG Interpretation  Date/Time:  Saturday August 31 2020 20:48:21 EST Ventricular Rate:  102 PR Interval:    QRS Duration: 110 QT Interval:  407 QTC Calculation: 531 R Axis:   52 Text Interpretation: Sinus tachycardia Ventricular premature complex Minimal ST depression, diffuse leads Prolonged QT interval Baseline wander in lead(s) V6 Confirmed by Davonna Belling [phone]) on 08/31/2020 9:44:15 PM   Radiology DG Chest Portable 1 View  Result Date: 08/31/2020 CLINICAL DATA:  Shortness of breath. EXAM: PORTABLE CHEST 1 VIEW COMPARISON:  November 30, 2018 FINDINGS: The heart size and mediastinal contours are within normal limits. Both lungs are clear. No pneumothorax or pleural effusion is noted. The visualized skeletal structures are unremarkable. IMPRESSION: No active disease. Electronically Signed   By: Marijo Conception M.D.   On: 08/31/2020 17:06    Procedures Procedures (including critical care time)  Medications Ordered in ED Medications  albuterol (VENTOLIN HFA) 108 (90 Base) MCG/ACT inhaler 2 puff (2 puffs Inhalation Given 08/31/20 2114)  pravastatin (PRAVACHOL) tablet 40 mg (has no administration in time range)  dexamethasone (DECADRON) injection 6 mg (has no administration in time range)  oxyCODONE-acetaminophen (PERCOCET/ROXICET) 5-325 MG per tablet 1 tablet (1 tablet Oral Given 08/31/20 2123)  metoprolol tartrate (LOPRESSOR) tablet 50 mg (50 mg Oral Given 08/31/20 2136)  metFORMIN (GLUCOPHAGE) tablet 1,000 mg (1,000 mg Oral Given 08/31/20 2136)    ED Course  I have reviewed the triage vital  signs and the nursing notes.  Pertinent labs & imaging results that were available during my care of the patient were reviewed by me and considered in my medical decision making (see chart for details).    MDM Rules/Calculators/A&P                          Patient with shortness of breath occasional cough.  Some vomiting.  Hemoglobin at baseline.  Some neck pain.  States at times he has some tremors in his hand.  I think with the tremors does not appear to be seizure or stroke.  They more likely metabolic with worsening kidney function.  Chest x-ray reassuring.  Mild hypoxia at times.  Feels better with ambulation.  Hemoglobin is low but appears to be near baseline.  Has not been bleeding.  Will need to follow-up with PCP.  Covid test done and can be followed as an outpatient.  Return for worsening symptoms.  Patient developed hypoxia prior to discharge.  Sats went down to the 80s.  With this would require admission to hospital.  Has had some chills at times.  Feels better on oxygen.  Covid test done and positive.  Will admit to hospital.  Patient wants admission to [facility] system as opposed to [facility].  Will discuss with hospitalist.  Dexamethasone and remdesivir ordered. Final Clinical Impression(s) / ED Diagnoses Final diagnoses:  Nausea and vomiting, intractability of vomiting not specified, unspecified vomiting type  Dyspnea, unspecified type  Neck pain  Chronic kidney disease, unspecified CKD stage  Hypoxia  COVID-19    Rx / DC Orders ED Discharge Orders         Ordered    ondansetron (ZOFRAN-ODT) 4 MG disintegrating tablet  Every 8 hours PRN        08/31/20 2041    methocarbamol (ROBAXIN) 500 MG tablet  Every 8 hours PRN  08/31/20 2041           Davonna Belling, MD 08/31/20 2224

## 2020-08-31 NOTE — ED Notes (Signed)
Took over for patient. Found to be hypoxic at 82%. Placed on 4L [place]. RT called to bedside for albuterol tx and assessment.

## 2020-08-31 NOTE — ED Notes (Signed)
Pt's o2 Sats are 91-92% with a good waveform after walking to triage area from waiting room. Went up to 95% after a few minutes of sitting. RN aware.

## 2020-08-31 NOTE — Discharge Instructions (Signed)
Your kidney function looks similar to before.  Your x-ray is reassuring.  A Covid test has been done but the results have not come back.  Follow-up with my chart for the results.  Return for worsening shortness of breath.  Take the muscle relaxer to help with the neck pain.

## 2020-09-01 LAB — LACTATE DEHYDROGENASE: LDH: 200 U/L — ABNORMAL HIGH (ref 98–192)

## 2020-09-01 LAB — FIBRINOGEN: Fibrinogen: 504 mg/dL — ABNORMAL HIGH (ref 210–475)

## 2020-09-01 LAB — TRIGLYCERIDES: Triglycerides: 214 mg/dL — ABNORMAL HIGH (ref <150)

## 2020-09-01 LAB — PROCALCITONIN: Procalcitonin: 2.33 ng/mL

## 2020-09-01 LAB — C-REACTIVE PROTEIN: CRP: 1.6 mg/dL — ABNORMAL HIGH (ref <1.0)

## 2020-09-01 LAB — FERRITIN: Ferritin: 127 ng/mL (ref 24–336)

## 2020-09-01 MED ORDER — ACETAMINOPHEN 325 MG PO TABS
650.0000 mg | ORAL_TABLET | Freq: Once | ORAL | Status: AC
  Administered 2020-09-01: 650 mg via ORAL
  Filled 2020-09-01: qty 2

## 2020-09-01 MED ORDER — OXYCODONE-ACETAMINOPHEN 5-325 MG PO TABS
1.0000 | ORAL_TABLET | Freq: Once | ORAL | Status: AC
  Administered 2020-09-01: 1 via ORAL
  Filled 2020-09-01: qty 1

## 2020-09-01 NOTE — ED Notes (Signed)
Trying to verify home medications, patient is a poor historian as to medications and dosages

## 2020-09-02 ENCOUNTER — Inpatient Hospital Stay (HOSPITAL_COMMUNITY): Payer: BC Managed Care – PPO

## 2020-09-02 ENCOUNTER — Encounter (HOSPITAL_COMMUNITY): Payer: Self-pay | Admitting: Family Medicine

## 2020-09-02 DIAGNOSIS — M48061 Spinal stenosis, lumbar region without neurogenic claudication: Secondary | ICD-10-CM | POA: Diagnosis present

## 2020-09-02 DIAGNOSIS — Z7982 Long term (current) use of aspirin: Secondary | ICD-10-CM | POA: Diagnosis not present

## 2020-09-02 DIAGNOSIS — I480 Paroxysmal atrial fibrillation: Secondary | ICD-10-CM | POA: Diagnosis present

## 2020-09-02 DIAGNOSIS — N184 Chronic kidney disease, stage 4 (severe): Secondary | ICD-10-CM | POA: Diagnosis present

## 2020-09-02 DIAGNOSIS — U071 COVID-19: Principal | ICD-10-CM

## 2020-09-02 DIAGNOSIS — J1282 Pneumonia due to coronavirus disease 2019: Secondary | ICD-10-CM | POA: Diagnosis present

## 2020-09-02 DIAGNOSIS — R000 Tachycardia, unspecified: Secondary | ICD-10-CM | POA: Diagnosis present

## 2020-09-02 DIAGNOSIS — E1151 Type 2 diabetes mellitus with diabetic peripheral angiopathy without gangrene: Secondary | ICD-10-CM | POA: Diagnosis present

## 2020-09-02 DIAGNOSIS — D631 Anemia in chronic kidney disease: Secondary | ICD-10-CM | POA: Diagnosis present

## 2020-09-02 DIAGNOSIS — Z9049 Acquired absence of other specified parts of digestive tract: Secondary | ICD-10-CM | POA: Diagnosis not present

## 2020-09-02 DIAGNOSIS — Z833 Family history of diabetes mellitus: Secondary | ICD-10-CM | POA: Diagnosis not present

## 2020-09-02 DIAGNOSIS — E7800 Pure hypercholesterolemia, unspecified: Secondary | ICD-10-CM | POA: Diagnosis present

## 2020-09-02 DIAGNOSIS — Z7984 Long term (current) use of oral hypoglycemic drugs: Secondary | ICD-10-CM | POA: Diagnosis not present

## 2020-09-02 DIAGNOSIS — M542 Cervicalgia: Secondary | ICD-10-CM | POA: Diagnosis present

## 2020-09-02 DIAGNOSIS — E1122 Type 2 diabetes mellitus with diabetic chronic kidney disease: Secondary | ICD-10-CM | POA: Diagnosis present

## 2020-09-02 DIAGNOSIS — I129 Hypertensive chronic kidney disease with stage 1 through stage 4 chronic kidney disease, or unspecified chronic kidney disease: Secondary | ICD-10-CM | POA: Diagnosis present

## 2020-09-02 DIAGNOSIS — Z87891 Personal history of nicotine dependence: Secondary | ICD-10-CM | POA: Diagnosis not present

## 2020-09-02 DIAGNOSIS — I10 Essential (primary) hypertension: Secondary | ICD-10-CM | POA: Diagnosis present

## 2020-09-02 DIAGNOSIS — Z7901 Long term (current) use of anticoagulants: Secondary | ICD-10-CM | POA: Diagnosis not present

## 2020-09-02 DIAGNOSIS — E1159 Type 2 diabetes mellitus with other circulatory complications: Secondary | ICD-10-CM | POA: Diagnosis not present

## 2020-09-02 DIAGNOSIS — Z880 Allergy status to penicillin: Secondary | ICD-10-CM | POA: Diagnosis not present

## 2020-09-02 DIAGNOSIS — J9600 Acute respiratory failure, unspecified whether with hypoxia or hypercapnia: Secondary | ICD-10-CM | POA: Diagnosis present

## 2020-09-02 DIAGNOSIS — N179 Acute kidney failure, unspecified: Secondary | ICD-10-CM | POA: Diagnosis present

## 2020-09-02 DIAGNOSIS — I4891 Unspecified atrial fibrillation: Secondary | ICD-10-CM | POA: Diagnosis not present

## 2020-09-02 DIAGNOSIS — J9601 Acute respiratory failure with hypoxia: Secondary | ICD-10-CM | POA: Diagnosis present

## 2020-09-02 DIAGNOSIS — N189 Chronic kidney disease, unspecified: Secondary | ICD-10-CM

## 2020-09-02 DIAGNOSIS — Z79899 Other long term (current) drug therapy: Secondary | ICD-10-CM | POA: Diagnosis not present

## 2020-09-02 LAB — BLOOD CULTURE ID PANEL (REFLEXED) - BCID2

## 2020-09-02 LAB — C-REACTIVE PROTEIN: CRP: 1.3 mg/dL — ABNORMAL HIGH (ref <1.0)

## 2020-09-02 LAB — CBC WITH DIFFERENTIAL/PLATELET
Abs Immature Granulocytes: 0.02 10*3/uL (ref 0.00–0.07)
Basophils Absolute: 0 10*3/uL (ref 0.0–0.1)
Basophils Relative: 0 %
Eosinophils Absolute: 0 10*3/uL (ref 0.0–0.5)
Eosinophils Relative: 0 %
HCT: 27.2 % — ABNORMAL LOW (ref 39.0–52.0)
Hemoglobin: 9.1 g/dL — ABNORMAL LOW (ref 13.0–17.0)
Immature Granulocytes: 1 %
Lymphocytes Relative: 20 %
Lymphs Abs: 0.6 10*3/uL — ABNORMAL LOW (ref 0.7–4.0)
MCH: 34 pg (ref 26.0–34.0)
MCHC: 33.5 g/dL (ref 30.0–36.0)
MCV: 101.5 fL — ABNORMAL HIGH (ref 80.0–100.0)
Monocytes Absolute: 0.2 10*3/uL (ref 0.1–1.0)
Monocytes Relative: 6 %
Neutro Abs: 2.1 10*3/uL (ref 1.7–7.7)
Neutrophils Relative %: 73 %
Platelets: 173 10*3/uL (ref 150–400)
RBC: 2.68 MIL/uL — ABNORMAL LOW (ref 4.22–5.81)
RDW: 13.1 % (ref 11.5–15.5)
WBC: 2.8 10*3/uL — ABNORMAL LOW (ref 4.0–10.5)
nRBC: 0 % (ref 0.0–0.2)

## 2020-09-02 LAB — COMPREHENSIVE METABOLIC PANEL
ALT: 16 U/L (ref 0–44)
AST: 27 U/L (ref 15–41)
Albumin: 2.2 g/dL — ABNORMAL LOW (ref 3.5–5.0)
Alkaline Phosphatase: 142 U/L — ABNORMAL HIGH (ref 38–126)
Anion gap: 12 (ref 5–15)
BUN: 52 mg/dL — ABNORMAL HIGH (ref 6–20)
CO2: 26 mmol/L (ref 22–32)
Calcium: 7.6 mg/dL — ABNORMAL LOW (ref 8.9–10.3)
Chloride: 97 mmol/L — ABNORMAL LOW (ref 98–111)
Creatinine, Ser: 4.17 mg/dL — ABNORMAL HIGH (ref 0.61–1.24)
GFR, Estimated: 16 mL/min — ABNORMAL LOW (ref >60)
Glucose, Bld: 226 mg/dL — ABNORMAL HIGH (ref 70–99)
Potassium: 3.7 mmol/L (ref 3.5–5.1)
Sodium: 135 mmol/L (ref 135–145)
Total Bilirubin: 0.4 mg/dL (ref 0.3–1.2)
Total Protein: 5.6 g/dL — ABNORMAL LOW (ref 6.5–8.1)

## 2020-09-02 LAB — PROTIME-INR
INR: 1.2 (ref 0.8–1.2)
Prothrombin Time: 14.5 seconds (ref 11.4–15.2)

## 2020-09-02 LAB — BRAIN NATRIURETIC PEPTIDE: B Natriuretic Peptide: 780 pg/mL — ABNORMAL HIGH (ref 0.0–100.0)

## 2020-09-02 LAB — GLUCOSE, CAPILLARY
Glucose-Capillary: 222 mg/dL — ABNORMAL HIGH (ref 70–99)
Glucose-Capillary: 233 mg/dL — ABNORMAL HIGH (ref 70–99)
Glucose-Capillary: 220 mg/dL — ABNORMAL HIGH (ref 70–99)
Glucose-Capillary: 153 mg/dL — ABNORMAL HIGH (ref 70–99)

## 2020-09-02 LAB — APTT: aPTT: >200 seconds (ref 24–36)

## 2020-09-02 LAB — D-DIMER, QUANTITATIVE: D-Dimer, Quant: 0.28 ug/mL-FEU (ref 0.00–0.50)

## 2020-09-02 LAB — TROPONIN I (HIGH SENSITIVITY): Troponin I (High Sensitivity): 35 ng/L — ABNORMAL HIGH (ref <18)

## 2020-09-02 LAB — TSH: TSH: 1.203 u[IU]/mL (ref 0.350–4.500)

## 2020-09-02 LAB — HIV ANTIBODY (ROUTINE TESTING W REFLEX): HIV Screen 4th Generation wRfx: NONREACTIVE

## 2020-09-02 MED ORDER — AMLODIPINE BESYLATE 10 MG PO TABS
10.0000 mg | ORAL_TABLET | Freq: Every day | ORAL | Status: DC
  Administered 2020-09-02 – 2020-09-03 (×2): 10 mg via ORAL
  Filled 2020-09-02 (×2): qty 1

## 2020-09-02 MED ORDER — ASCORBIC ACID 500 MG PO TABS
500.0000 mg | ORAL_TABLET | Freq: Every day | ORAL | Status: DC
  Administered 2020-09-02 – 2020-09-03 (×2): 500 mg via ORAL
  Filled 2020-09-02 (×2): qty 1

## 2020-09-02 MED ORDER — PROSOURCE PLUS PO LIQD: 30.0000 mL | Freq: Four times a day (QID) | ORAL | Status: DC

## 2020-09-02 MED ORDER — B COMPLEX-C PO TABS
1.0000 | ORAL_TABLET | Freq: Every day | ORAL | Status: DC
  Administered 2020-09-02 – 2020-09-03 (×2): 1 via ORAL
  Filled 2020-09-02 (×2): qty 1

## 2020-09-02 MED ORDER — APIXABAN 5 MG PO TABS
5.0000 mg | ORAL_TABLET | Freq: Two times a day (BID) | ORAL | Status: DC
  Administered 2020-09-02 – 2020-09-03 (×3): 5 mg via ORAL
  Filled 2020-09-02 (×3): qty 1

## 2020-09-02 MED ORDER — ASPIRIN EC 81 MG PO TBEC
81.0000 mg | DELAYED_RELEASE_TABLET | Freq: Every day | ORAL | Status: DC
  Administered 2020-09-02 – 2020-09-03 (×2): 81 mg via ORAL
  Filled 2020-09-02 (×2): qty 1

## 2020-09-02 MED ORDER — ACETAMINOPHEN 325 MG PO TABS: 650.0000 mg | ORAL_TABLET | Freq: Four times a day (QID) | ORAL | Status: DC | PRN

## 2020-09-02 MED ORDER — CLOPIDOGREL BISULFATE 75 MG PO TABS: 75.0000 mg | ORAL_TABLET | Freq: Every day | ORAL | Status: DC

## 2020-09-02 MED ORDER — ZINC SULFATE 220 (50 ZN) MG PO CAPS
220.0000 mg | ORAL_CAPSULE | Freq: Every day | ORAL | Status: DC
  Administered 2020-09-02 – 2020-09-03 (×2): 220 mg via ORAL
  Filled 2020-09-02 (×2): qty 1

## 2020-09-02 MED ORDER — METOPROLOL TARTRATE 5 MG/5ML IV SOLN
5.0000 mg | Freq: Once | INTRAVENOUS | Status: AC
  Administered 2020-09-02: 5 mg via INTRAVENOUS
  Filled 2020-09-02: qty 5

## 2020-09-02 MED ORDER — ACETAMINOPHEN 650 MG RE SUPP: 650.0000 mg | Freq: Four times a day (QID) | RECTAL | Status: DC | PRN

## 2020-09-02 MED ORDER — TRAMADOL HCL 50 MG PO TABS
50.0000 mg | ORAL_TABLET | Freq: Four times a day (QID) | ORAL | Status: DC | PRN
  Administered 2020-09-02 (×3): 50 mg via ORAL
  Filled 2020-09-02 (×3): qty 1

## 2020-09-02 MED ORDER — INSULIN ASPART 100 UNIT/ML ~~LOC~~ SOLN
0.0000 [IU] | Freq: Three times a day (TID) | SUBCUTANEOUS | Status: DC
  Administered 2020-09-02 (×3): 3 [IU] via SUBCUTANEOUS
  Administered 2020-09-03: 7 [IU] via SUBCUTANEOUS
  Administered 2020-09-03: 3 [IU] via SUBCUTANEOUS

## 2020-09-02 MED ORDER — VANCOMYCIN HCL 2000 MG/400ML IV SOLN
2000.0000 mg | Freq: Once | INTRAVENOUS | Status: AC
  Administered 2020-09-02: 2000 mg via INTRAVENOUS
  Filled 2020-09-02: qty 400

## 2020-09-02 MED ORDER — EZETIMIBE 10 MG PO TABS
10.0000 mg | ORAL_TABLET | Freq: Every day | ORAL | Status: DC
  Administered 2020-09-02 – 2020-09-03 (×2): 10 mg via ORAL
  Filled 2020-09-02 (×2): qty 1

## 2020-09-02 MED ORDER — PANTOPRAZOLE SODIUM 40 MG PO TBEC
40.0000 mg | DELAYED_RELEASE_TABLET | Freq: Every day | ORAL | Status: DC
  Administered 2020-09-02 – 2020-09-03 (×2): 40 mg via ORAL
  Filled 2020-09-02 (×2): qty 1

## 2020-09-02 MED ORDER — HEPARIN (PORCINE) 25000 UT/250ML-% IV SOLN
1600.0000 [IU]/h | INTRAVENOUS | Status: AC
  Administered 2020-09-02: 1600 [IU]/h via INTRAVENOUS
  Filled 2020-09-02: qty 250

## 2020-09-02 MED ORDER — METOPROLOL SUCCINATE ER 100 MG PO TB24
100.0000 mg | ORAL_TABLET | Freq: Every day | ORAL | Status: DC
  Administered 2020-09-03: 100 mg via ORAL
  Filled 2020-09-02: qty 1

## 2020-09-02 MED ORDER — METOPROLOL SUCCINATE ER 50 MG PO TB24
75.0000 mg | ORAL_TABLET | Freq: Every day | ORAL | Status: DC
  Administered 2020-09-02: 75 mg via ORAL
  Filled 2020-09-02: qty 1

## 2020-09-02 MED ORDER — HEPARIN BOLUS VIA INFUSION
4000.0000 [IU] | Freq: Once | INTRAVENOUS | Status: AC
  Administered 2020-09-02: 4000 [IU] via INTRAVENOUS
  Filled 2020-09-02: qty 4000

## 2020-09-02 NOTE — Progress Notes (Signed)
ANTICOAGULATION CONSULT NOTE - Initial Consult  Pharmacy Consult for apixaban Indication: atrial fibrillation  Allergies  Allergen Reactions  . Glipizide Itching    Other reaction(s): Other (See Comments) Headache Weight gain Headache Headache Weight gain   . Penicillins Itching    One time reaction   . Pioglitazone Itching    Other reaction(s): Other (See Comments) Weight gain Weight gain    Patient Measurements: Height: 6\' 1"  (185.4 cm) Weight: 116.3 kg (256 lb 6.3 oz) IBW/kg (Calculated) : 79.9  Vital Signs: Temp: 98 F (36.7 C) (01/03 0857) Temp Source: Oral (01/03 0857) BP: 144/94 (01/03 0857) Pulse Rate: 100 (01/03 0857)  Labs: Recent Labs    08/31/20 1610 08/31/20 1612 08/31/20 1822 09/02/20 0739  HGB 8.7*  --   --  9.1*  HCT 25.8*  --   --  27.2*  PLT 171  --   --  173  APTT  --   --   --  >200*  LABPROT  --   --   --  14.5  INR  --   --   --  1.2  CREATININE 4.18*  --   --  4.17*  TROPONINIHS  --  41* 45* 35*    Estimated Creatinine Clearance: 26.4 mL/min (A) (by C-G formula based on SCr of 4.17 mg/dL (H)).   Medical History: Past Medical History:  Diagnosis Date  . Back pain   . Diabetes mellitus without complication ([place])   . High cholesterol   . Hypertension   . Obesity   . Renal disorder     Medications: Pt not on anticoagulants PTA -Prescribed ASA and clopidogrel PTA  Assessment: Pt is a 55 year old male currently on heparin drip for atrial fibrillation. Cardiology consulted pharmacy to transition to apixaban.   Today, 09/02/20  Age: 55  Wt: 116 kg  SCr: 4.17, CrCl ~27 mL/min   Plan:   Discontinue heparin drip  Initiate apixaban 5 mg PO BID  Patient education and coupon provided  CBC with AM labs tomorrow  Monitor for signs of bleeding  Lenis Noon, PharmD 09/02/2020,12:46 PM

## 2020-09-02 NOTE — Progress Notes (Signed)
Initial Nutrition Assessment  RD working remotely.  DOCUMENTATION CODES:   Obesity unspecified  INTERVENTION:  - will order 30 ml Prosource Plus QID, each supplement provides 100 kcal and 15 grams protein.  - will order vitamin B complex with vitamin C.   NUTRITION DIAGNOSIS:   Increased nutrient needs related to acute illness,catabolic illness ([id number]-42 infection) as evidenced by estimated needs  GOAL:   Patient will meet greater than or equal to 90% of their needs  MONITOR:   PO intake,Supplement acceptance,Labs,Weight trends  REASON FOR ASSESSMENT:   Malnutrition Screening Tool  ASSESSMENT:   55 y.o. male with medical history of type 2 DM, PVD s/p femoropopliteal bypass on LLE with stenting, stage 4 CKD with worsening over the past few months, HTN, and anemia. He was referred to the ED by a Nephrologist when his creatinine was found to be 4.2 mg/dl. Patient reported to ED staff that he has had a poor appetite, is lethargic, and has had some nausea without vomiting or diarrhea.  Diet advanced from NPO to Renal/Carb Modified with 1.2 L fluid restriction today at 0550. He has not yet received breakfast.   Weight today is 256 lb and on 1/1 was documented as 260 lb, which appears to have been a stated weight.   PTA, the most recently documented weight was on 08/29/20 at [facility] when he weighed 264 lb. This would indicate 8 lb weight loss (3% body weight) in <1 week. Prior to that, the most recent weight was on  11/29/18 when he weighed 274 lb.   No information documented in the edema section of flow sheet at this time.   Per notes, plan for Nephrology consult. Will monitor for plans.    Labs reviewed; CBG: 222 mg/dl, Cl: 97 mmol/l, BUN: 52 mg/dl, creatinine: 4.17 mg/dl, Ca: 7.6 mg/dl, Alk Phos elevated, GFR: 16 ml/min. Medications reviewed; 500 mg ascorbic acid/day, sliding scale novolog, 40 mg oral protonix/day, 100 mg IV remdesivir x1 dose/day x4 days (1/2-1/5), 220 mg zinc  sulfate/day.     NUTRITION - FOCUSED PHYSICAL EXAM:  unable to complete at this time.   Diet Order:   Diet Order            Diet renal/carb modified with fluid restriction Diet-HS Snack? Nothing; Fluid restriction: 1200 mL Fluid; Room service appropriate? Yes; Fluid consistency: Thin  Diet effective now                 EDUCATION NEEDS:   Not appropriate for education at this time  Skin:  Skin Assessment: Reviewed RN Assessment  Last BM:  PTA/unknown  Height:   Ht Readings from Last 1 Encounters:  09/02/20 6' 1"  (1.854 m)    Weight:   Wt Readings from Last 1 Encounters:  09/02/20 116.3 kg    Estimated Nutritional Needs:  Kcal:  2000-2200 kcal Protein:  110-125 grams Fluid:  >/= 1.8 L/day      Jarome Matin, MS, RD, LDN, CNSC Inpatient Clinical Dietitian RD pager # available in [facility]  After hours/weekend pager # available in [facility]

## 2020-09-02 NOTE — Plan of Care (Signed)
  Problem: Pain Managment: Goal: General experience of comfort will improve Description: General experience of comfort will improve by 09/06/19 Outcome: Progressing

## 2020-09-02 NOTE — Progress Notes (Signed)
ANTICOAGULATION CONSULT NOTE - Initial Consult  Pharmacy Consult for heparin Indication: atrial fibrillation  Allergies  Allergen Reactions  . Penicillins Itching    Patient Measurements: Height: 6\' 1"  (185.4 cm) Weight: 116.3 kg (256 lb 6.3 oz) IBW/kg (Calculated) : 79.9 Heparin Dosing Weight: TBW  Vital Signs: Temp: 97.7 F (36.5 C) (01/03 0449) Temp Source: Axillary (01/03 0449) BP: 140/94 (01/03 0449) Pulse Rate: 105 (01/03 0449)  Labs: Recent Labs    08/31/20 1610 08/31/20 1612 08/31/20 1822  HGB 8.7*  --   --   HCT 25.8*  --   --   PLT 171  --   --   CREATININE 4.18*  --   --   TROPONINIHS  --  41* 45*    Estimated Creatinine Clearance: 26.4 mL/min (A) (by C-G formula based on SCr of 4.18 mg/dL (H)).   Medical History: Past Medical History:  Diagnosis Date  . Back pain   . Diabetes mellitus without complication ([place])   . High cholesterol   . Hypertension   . Obesity   . Renal disorder      Assessment: 55 yo male feeling bad over the last few days.  History of stage IV renal disease.  Baseline Scr ~ 4.4.  Pt has some SOB and an episode of vomiting.  Pharmacy consulted to dose heparin for Afib.  No prior AC noted  09/02/2020 Baseline labs ordered stat Hgb 8.7 (1/1) Plts 171 (1/1)   Goal of Therapy:  Heparin level 0.3-0.7 units/ml    Plan:  Heparin bolus 4000 units x 1 Start heparin drip at 1600 units/hr Heparin level in 6 hours Daily CBC  Dolly Rias RPh 09/02/2020, 6:02 AM   Pharmacy also consulted to dose Vancomycin for sepsis  A/P: Due to renal function give vancomycin  2gm IV x 1 then obtain random vanc level ~ 48 hours  Dolly Rias RPh 09/02/2020, 6:16 AM

## 2020-09-02 NOTE — H&P (Signed)
History and Physical    Tajai Ihde WRU:[id number] DOB: Dec 04, 1965 DOA: 08/31/2020  PCP: Robyne Peers, MD  Patient coming from: Home.  Chief Complaint: Abnormal labs.  HPI: Jamie Pittman is a 55 y.o. male with history of diabetes mellitus type 2, peripheral vascular disease status post femoropopliteal bypass on the left lower extremity with stenting, chronic kidney disease stage IV, hypertension, anemia has been having progressive worsening kidney disease over the last few months.  In May 2021 patient's creatinine was around 2 and when patient follows up with his primary care physician in December creatinine was found to be around 3.9 and was referred to a nephrologist and his creatinine was further worsening around 4.2.  At this point patient was referred to the ER.  Patient states last few days he has been a poor appetite and lethargic had some nausea denies vomiting or diarrhea.  Denies fever chills.  ED Course: In the ER patient was initially found to be hypoxic requiring 2 L oxygen to maintain sats Covid test came back positive.  Chest x-ray was unremarkable creatinine was 4.18 hemoglobin 8.7 D-dimer 1.49 CRP 1.6.  Patient was started on Decadron and remdesivir for Covid.  And plan was to admit patient and was transferred to [facility].  While being transferred patient went into A. fib with RVR was given 1 dose of metoprolol 5 mg IV.  At the time of my exam patient is in tachycardia heart rate is around 120 bpm.  Denies any chest pain but does have complaint of low back pain.  Review of Systems: As per HPI, rest all negative.   Past Medical History:  Diagnosis Date  . Back pain   . Diabetes mellitus without complication ([place])   . High cholesterol   . Hypertension   . Obesity   . Renal disorder     Past Surgical History:  Procedure Laterality Date  . BACK SURGERY    . CHOLECYSTECTOMY    . vascular stent leg       reports that he has quit smoking. He has never  used smokeless tobacco. He reports current alcohol use of about 1.0 standard drink of alcohol per week. He reports that he does not use drugs.  Allergies  Allergen Reactions  . Penicillins Itching    Family History  Problem Relation Age of Onset  . Diabetes Mellitus II Maternal Grandmother     Prior to Admission medications   Medication Sig Start Date End Date Taking? Authorizing Provider  amLODipine (NORVASC) 10 MG tablet  11/15/18  Yes [provider]  clopidogrel (PLAVIX) 75 MG tablet  11/01/18  Yes [provider]  metFORMIN (GLUCOPHAGE) 1000 MG tablet Take by mouth. 04/13/17 11/05/20 Yes [provider]  methocarbamol (ROBAXIN) 500 MG tablet Take 1 tablet (500 mg total) by mouth every 8 (eight) hours as needed for muscle spasms. 08/31/20  Yes Davonna Belling, MD  metoprolol succinate (TOPROL-XL) 50 MG 24 hr tablet TAKE 1 AND 1/2 TABLETS BY MOUTH EVERY DAY 03/31/17  Yes [provider]  ondansetron (ZOFRAN-ODT) 4 MG disintegrating tablet Take 1 tablet (4 mg total) by mouth every 8 (eight) hours as needed for nausea or vomiting. 08/31/20  Yes Davonna Belling, MD  pioglitazone (ACTOS) 15 MG tablet  11/14/18  Yes [provider]  [place] [phone] [place]) MCG/ACT AEPB  11/24/18  Yes [provider]  traMADol (ULTRAM) 50 MG tablet as needed. 07/15/20  Yes [provider]  aspirin  EC 81 MG tablet Take by mouth. 02/17/17   [provider]  ezetimibe (ZETIA) 10 MG tablet TAKE ONE (1) TABLET BY MOUTH EVERY DAY 10/12/18   [provider]  famotidine (PEPCID) 20 MG tablet Take by mouth. 11/25/18 12/25/18  [provider]  pantoprazole (PROTONIX) 40 MG tablet Take by mouth. 10/12/18 04/10/19  [provider]  rosuvastatin (CRESTOR) 40 MG tablet TAKE ONE (1) TABLET BY MOUTH EVERY DAY 03/31/17   [provider]    Physical Exam: Constitutional: Moderately built and nourished. Vitals:   09/01/20  2313 09/02/20 0100 09/02/20 0309 09/02/20 0449  BP: (!) 196/85 (!) 180/87 (!) 173/95 (!) 140/94  Pulse: 87 86 65 (!) 105  Resp: 18 13 16 16   Temp:   98.3 F (36.8 C) 97.7 F (36.5 C)  TempSrc:   Oral Axillary  SpO2: 100% 100% 98% 97%  Weight:    116.3 kg  Height:    6\' 1"  (1.854 m)   Eyes: Anicteric no pallor. ENMT: No discharge from the ears eyes nose or mouth. Neck: No mass felt.  No neck rigidity. Respiratory: No rhonchi or crepitations. Cardiovascular: S1-S2 heard. Abdomen: Soft nontender bowel sounds present. Musculoskeletal: No edema. Skin: No rash. Neurologic: Alert awake oriented to time place and person.  Moves all extremities. Psychiatric: Appears normal.  Normal affect.   Labs on Admission: I have personally reviewed following labs and imaging studies  CBC: Recent Labs  Lab 08/31/20 1610  WBC 4.3  HGB 8.7*  HCT 25.8*  MCV 100.0  PLT 765   Basic Metabolic Panel: Recent Labs  Lab 08/31/20 1610  NA 134*  K 3.8  CL 92*  CO2 30  GLUCOSE 158*  BUN 34*  CREATININE 4.18*  CALCIUM 8.0*   GFR: Estimated Creatinine Clearance: 26.4 mL/min (A) (by C-G formula based on SCr of 4.18 mg/dL (H)). Liver Function Tests: Recent Labs  Lab 08/31/20 1610  AST 65*  ALT 21  ALKPHOS 156*  BILITOT 0.3  PROT 5.8*  ALBUMIN 2.4*   Recent Labs  Lab 08/31/20 1610  LIPASE 24   No results for input(s): AMMONIA in the last 168 hours. Coagulation Profile: No results for input(s): INR, PROTIME in the last 168 hours. Cardiac Enzymes: No results for input(s): CKTOTAL, CKMB, CKMBINDEX, TROPONINI in the last 168 hours. BNP (last 3 results) No results for input(s): PROBNP in the last 8760 hours. HbA1C: No results for input(s): HGBA1C in the last 72 hours. CBG: No results for input(s): GLUCAP in the last 168 hours. Lipid Profile: Recent Labs    08/31/20 2230  TRIG 214*   Thyroid Function Tests: No results for input(s): TSH, T4TOTAL, FREET4, T3FREE, THYROIDAB in the  last 72 hours. Anemia Panel: Recent Labs    08/31/20 2230  FERRITIN 127   Urine analysis:    Component Value Date/Time   COLORURINE YELLOW 08/31/2020 1822   APPEARANCEUR CLEAR 08/31/2020 1822   LABSPEC 1.010 08/31/2020 1822   PHURINE 7.5 08/31/2020 1822   GLUCOSEU NEGATIVE 08/31/2020 1822   HGBUR TRACE (A) 08/31/2020 1822   BILIRUBINUR NEGATIVE 08/31/2020 1822   KETONESUR NEGATIVE 08/31/2020 1822   PROTEINUR >300 (A) 08/31/2020 1822   NITRITE NEGATIVE 08/31/2020 1822   LEUKOCYTESUR NEGATIVE 08/31/2020 1822   Sepsis Labs: @LABRCNTIP (procalcitonin:4,lacticidven:4) ) Recent Results (from the past 240 hour(s))  Resp Panel by RT-PCR (Flu A&B, Covid) Nasopharyngeal Swab     Status: Abnormal   Collection Time: 08/31/20  8:10 PM   Specimen: Nasopharyngeal Swab;  Nasopharyngeal(NP) swabs in vial transport medium  Result Value Ref Range Status   SARS Coronavirus 2 by RT PCR POSITIVE (A) NEGATIVE Final    Comment: RESULT CALLED TO, READ BACK BY AND VERIFIED WITH: POWELL,V AT 2130 ON 175102 BY CHERESNOWSKY,T (NOTE) SARS-CoV-2 target nucleic acids are DETECTED.  The SARS-CoV-2 RNA is generally detectable in upper respiratory specimens during the acute phase of infection. Positive results are indicative of the presence of the identified virus, but do not rule out bacterial infection or co-infection with other pathogens not detected by the test. Clinical correlation with patient history and other diagnostic information is necessary to determine patient infection status. The expected result is Negative.  Fact Sheet for Patients: EntrepreneurPulse.com.au  Fact Sheet for Healthcare Providers: IncredibleEmployment.be  This test is not yet approved or cleared by the [place] FDA and  has been authorized for detection and/or diagnosis of SARS-CoV-2 by FDA under an Emergency Use Authorization (EUA).  This EUA will remain in effect (meaning this   test can be used) for the duration of  the COVID-19 declaration under Section 564(b)(1) of the Act, 21 U.S.C. section 360bbb-3(b)(1), unless the authorization is terminated or revoked sooner.     Influenza A by PCR NEGATIVE NEGATIVE Final   Influenza B by PCR NEGATIVE NEGATIVE Final    Comment: (NOTE) The Xpert Xpress SARS-CoV-2/FLU/RSV plus assay is intended as an aid in the diagnosis of influenza from Nasopharyngeal swab specimens and should not be used as a sole basis for treatment. Nasal washings and aspirates are unacceptable for Xpert Xpress SARS-CoV-2/FLU/RSV testing.  Fact Sheet for Patients: EntrepreneurPulse.com.au  Fact Sheet for Healthcare Providers: IncredibleEmployment.be  This test is not yet approved or cleared by the [place] FDA and has been authorized for detection and/or diagnosis of SARS-CoV-2 by FDA under an Emergency Use Authorization (EUA). This EUA will remain in effect (meaning this test can be used) for the duration of the COVID-19 declaration under Section 564(b)(1) of the Act, 21 U.S.C. section 360bbb-3(b)(1), unless the authorization is terminated or revoked.  Performed at [facility], [address] [place], [place] [address]   Blood Culture (routine x 2)     Status: None (Preliminary result)   Collection Time: 08/31/20 10:35 PM   Specimen: BLOOD  Result Value Ref Range Status   Specimen Description   Final    BLOOD RIGHT ANTECUBITAL Performed at [facility], [address] [place], [place] [address]    Special Requests   Final    BOTTLES DRAWN AEROBIC AND ANAEROBIC Blood Culture adequate volume Performed at [facility], [address] [place], [place] [address]    Culture  Setup Time   Final    GRAM POSITIVE COCCI IN CLUSTERS IN BOTH AEROBIC AND ANAEROBIC BOTTLES CRITICAL RESULT CALLED TO, READ BACK BY AND VERIFIED WITHSuezanne Jacquet RN [phone] 09/02/2020 Mena Goes Performed at [facility], [place] [address], [place], [place] [address]    Culture GRAM POSITIVE COCCI  Final   Report Status PENDING  Incomplete  Blood Culture ID Panel (Reflexed)     Status: Abnormal   Collection Time: 08/31/20 10:35 PM  Result Value Ref Range Status   Enterococcus faecalis NOT DETECTED NOT DETECTED Final   Enterococcus Faecium NOT DETECTED NOT DETECTED Final   Listeria monocytogenes NOT DETECTED NOT DETECTED Final   Staphylococcus species DETECTED (A) NOT DETECTED Final    Comment: CRITICAL RESULT CALLED TO,  READ BACK BY AND VERIFIED WITHSuezanne Jacquet RN [phone] 09/02/2020 T. TYSOR    Staphylococcus aureus (BCID) NOT DETECTED NOT DETECTED Final   Staphylococcus epidermidis DETECTED (A) NOT DETECTED Final    Comment: Methicillin (oxacillin) resistant coagulase negative staphylococcus. Possible blood culture contaminant (unless isolated from more than one blood culture draw or clinical case suggests pathogenicity). No antibiotic treatment is indicated for blood  culture contaminants. CRITICAL RESULT CALLED TO, READ BACK BY AND VERIFIED WITH: Suezanne Jacquet RN [phone] 09/02/2020 T. TYSOR    Staphylococcus lugdunensis NOT DETECTED NOT DETECTED Final   Streptococcus species NOT DETECTED NOT DETECTED Final   Streptococcus agalactiae NOT DETECTED NOT DETECTED Final   Streptococcus pneumoniae NOT DETECTED NOT DETECTED Final   Streptococcus pyogenes NOT DETECTED NOT DETECTED Final   A.calcoaceticus-baumannii NOT DETECTED NOT DETECTED Final   Bacteroides fragilis NOT DETECTED NOT DETECTED Final   Enterobacterales NOT DETECTED NOT DETECTED Final   Enterobacter cloacae complex NOT DETECTED NOT DETECTED Final   Escherichia coli NOT DETECTED NOT DETECTED Final   Klebsiella aerogenes NOT DETECTED NOT DETECTED Final   Klebsiella oxytoca NOT DETECTED NOT DETECTED Final   Klebsiella pneumoniae NOT DETECTED NOT DETECTED Final   Proteus species NOT DETECTED NOT  DETECTED Final   Salmonella species NOT DETECTED NOT DETECTED Final   Serratia marcescens NOT DETECTED NOT DETECTED Final   Haemophilus influenzae NOT DETECTED NOT DETECTED Final   Neisseria meningitidis NOT DETECTED NOT DETECTED Final   Pseudomonas aeruginosa NOT DETECTED NOT DETECTED Final   Stenotrophomonas maltophilia NOT DETECTED NOT DETECTED Final   Candida albicans NOT DETECTED NOT DETECTED Final   Candida auris NOT DETECTED NOT DETECTED Final   Candida glabrata NOT DETECTED NOT DETECTED Final   Candida krusei NOT DETECTED NOT DETECTED Final   Candida parapsilosis NOT DETECTED NOT DETECTED Final   Candida tropicalis NOT DETECTED NOT DETECTED Final   Cryptococcus neoformans/gattii NOT DETECTED NOT DETECTED Final   Methicillin resistance mecA/C DETECTED (A) NOT DETECTED Final    Comment: CRITICAL RESULT CALLED TO, READ BACK BY AND VERIFIED WITHSuezanne Jacquet RN [phone] 09/02/2020 Mena Goes Performed at [facility], [address] [place], [place] [address]      Radiological Exams on Admission: DG Chest Portable 1 View  Result Date: 08/31/2020 CLINICAL DATA:  Shortness of breath. EXAM: PORTABLE CHEST 1 VIEW COMPARISON:  November 30, 2018 FINDINGS: The heart size and mediastinal contours are within normal limits. Both lungs are clear. No pneumothorax or pleural effusion is noted. The visualized skeletal structures are unremarkable. IMPRESSION: No active disease. Electronically Signed   By: Marijo Conception M.D.   On: 08/31/2020 17:06    EKG: Independently reviewed.  A. fib with RVR.  Assessment/Plan Principal Problem:   Acute hypoxemic respiratory failure due to COVID-19 [facility]) Active Problems:   Renal failure (ARF), acute on chronic (HCC)   Tachycardia   Type 2 diabetes mellitus with vascular disease ([place])   Essential hypertension   Acute respiratory failure due to COVID-19 ([place])    1. Acute respiratory failure with hypoxia requiring 2 L oxygen to maintain sats more than  90%.  Patient sats had dropped to 82% when patient presented to the ER.  Presently on Decadron remdesivir.  Continue to monitor respiratory status and inflammatory markers. 2. Tachycardia likely new onset A. fib with RVR for which I have ordered 1 dose of metoprolol 5 mg IV and home dose of Toprol p.o.  If heart rate does not get  controlled and may need to be on Cardizem.  I started patient on heparin for anticoagulation since patient's chads 2 vasc score is at least 2.  Will consult cardiology.  Check troponins TSH and may need 2D echo. 3. Acute on chronic kidney disease with progression of renal failure will get nephrology input.  Follow intake output metabolic panel. 4. Hypertension uncontrolled closely on amlodipine 10 mg which patient's nephrologist recently increased.  Patient is also on Toprol.  Will follow blood pressure trends and keep patient as needed IV hydralazine. 5. Chronic low back pain with worsening will check x-ray lumbar spine. 6. Diabetes mellitus type 2 we will keep patient on sliding scale coverage. 7. Anemia likely from renal disease follow CBC. 8. Peripheral vascular disease on aspirin Plavix and statins. 9. Staph epidermidis seen in the blood cultures and given that patient also has elevated procalcitonin we will keep patient vancomycin until we get final culture results.  Since patient has Covid infection with respiratory failure worsening renal function new onset A. fib will need to monitor closely for any further worsening in inpatient status.   DVT prophylaxis: Heparin. Code Status: Full code. Family Communication: Discussed with patient. Disposition Plan: Home. Consults called: None. Admission status: Inpatient.   Rise Patience MD [facility] Pager [phone].  If 7PM-7AM, please contact night-coverage [email or web address] Password [facility]  09/02/2020, 5:54 AM

## 2020-09-02 NOTE — Consult Note (Addendum)
Cardiology Consultation:   Due to the COVID-19 pandemic, this visit was completed with telemedicine (audio/video) technology to reduce patient and provider exposure as well as to preserve personal protective equipment.   Patient ID: Jamie Pittman MRN: [id number]; DOB: 1966-01-11  Admit date: 08/31/2020 Date of Consult: 09/02/2020  Primary Care Provider: Robyne Peers, MD Primary Cardiologist: New to [facility]  Patient Profile:   Jamie Pittman is a 55 y.o. male with a hx of HTN, HLD, obesity, GERD, PVD s/p left femoropopliteal bypass (2003) with stenting (2018), DM2, Acute>>CKD stage IV from diabetic nephropathy followed by nephrology who is being seen today for the evaluation of atrial fibrillation at the request of Dr. Hal Hope.  History of Present Illness:   [name] is a 55yo M with a hx as stated above who presented to [facility] with c/o poor appetite, lethargy, N/V and diarrhea. He has most recently been having issues with declining renal function. He was seen in the OP setting 12/2019 with a creatinine at 2.0. When he was seen in follow up by his PCP 07/2020, his creatinine had risen to 3.9. He was eventually referred to a nephrologist with further worsening to 4.2 along with weight loss. Given his recent symptoms, he proceeded to the ED for further evaluation.   In the ED, he was found to be quite hypoxic which stabilized with supplemental O2. COVID testing returned as positive. Creatinine was elevated at 4.18. D-Dimer at 1.49 with CRP at 1.6. He was started on decadron and Remdesivir and was transferred to [facility] In route to [facility], he developed atrial fibrillation with RVR and was given 1 dose of metoprolol 5mg  IV. He remains in AF with rates in the low 100bpm range. He denies chest pain, prior hx of AF or palpitations. He has no prior hx of CV disease.   He recently underwent Lexiscan stress testing 11/2018 which was low risk with no evidence of ischemia or infarct.   Past Medical  History:  Diagnosis Date  . Back pain   . Diabetes mellitus without complication ([place])   . High cholesterol   . Hypertension   . Obesity   . Renal disorder     Past Surgical History:  Procedure Laterality Date  . BACK SURGERY    . CHOLECYSTECTOMY    . vascular stent leg       Home Medications:  Prior to Admission medications   Medication Sig Start Date End Date Taking? Authorizing Provider  amLODipine (NORVASC) 10 MG tablet  11/15/18  Yes [provider]  clopidogrel (PLAVIX) 75 MG tablet  11/01/18  Yes [provider]  metFORMIN (GLUCOPHAGE) 1000 MG tablet Take by mouth. 04/13/17 11/05/20 Yes [provider]  methocarbamol (ROBAXIN) 500 MG tablet Take 1 tablet (500 mg total) by mouth every 8 (eight) hours as needed for muscle spasms. 08/31/20  Yes Davonna Belling, MD  metoprolol succinate (TOPROL-XL) 50 MG 24 hr tablet TAKE 1 AND 1/2 TABLETS BY MOUTH EVERY DAY 03/31/17  Yes [provider]  ondansetron (ZOFRAN-ODT) 4 MG disintegrating tablet Take 1 tablet (4 mg total) by mouth every 8 (eight) hours as needed for nausea or vomiting. 08/31/20  Yes Davonna Belling, MD  pioglitazone (ACTOS) 15 MG tablet  11/14/18  Yes [provider]  Hilda [phone] Base) MCG/ACT AEPB  11/24/18  Yes [provider]  traMADol (ULTRAM) 50 MG tablet as needed. 07/15/20  Yes [provider]  aspirin EC 81 MG tablet Take by  mouth. 02/17/17   [provider]  ezetimibe (ZETIA) 10 MG tablet TAKE ONE (1) TABLET BY MOUTH EVERY DAY 10/12/18   [provider]  famotidine (PEPCID) 20 MG tablet Take by mouth. 11/25/18 12/25/18  [provider]  pantoprazole (PROTONIX) 40 MG tablet Take by mouth. 10/12/18 04/10/19  [provider]  rosuvastatin (CRESTOR) 40 MG tablet TAKE ONE (1) TABLET BY MOUTH EVERY DAY 03/31/17   [provider]    Inpatient Medications: Scheduled Meds: . (feeding supplement) PROSource Plus   30 mL Oral QID  . amLODipine  10 mg Oral Daily  . vitamin C  500 mg Oral Daily  . aspirin EC  81 mg Oral Daily  . B-complex with vitamin C  1 tablet Oral Daily  . clopidogrel  75 mg Oral Daily  . dexamethasone (DECADRON) injection  6 mg Intravenous Q24H  . ezetimibe  10 mg Oral Daily  . insulin aspart  0-9 Units Subcutaneous TID WC  . metoprolol succinate  75 mg Oral Daily  . pantoprazole  40 mg Oral Daily  . pravastatin  40 mg Oral q1800  . zinc sulfate  220 mg Oral Daily   Continuous Infusions: . heparin 1,600 Units/hr (09/02/20 0711)  . remdesivir 100 mg in NS 100 mL Stopped (09/01/20 0954)   PRN Meds: acetaminophen **OR** acetaminophen, traMADol  Allergies:    Allergies  Allergen Reactions  . Penicillins Itching    Social History:   Social History   Socioeconomic History  . Marital status: Married    Spouse name: Not on file  . Number of children: Not on file  . Years of education: Not on file  . Highest education level: Not on file  Occupational History  . Not on file  Tobacco Use  . Smoking status: Former Research scientist (life sciences)  . Smokeless tobacco: Never Used  Substance and Sexual Activity  . Alcohol use: Yes    Alcohol/week: 1.0 standard drink    Types: 1 Shots of liquor per week    Comment: every other day  . Drug use: Never  . Sexual activity: Not on file  Other Topics Concern  . Not on file  Social History Narrative  . Not on file   Social Determinants of Health   Financial Resource Strain: Not on file  Food Insecurity: Not on file  Transportation Needs: Not on file  Physical Activity: Not on file  Stress: Not on file  Social Connections: Not on file  Intimate Partner Violence: Not on file    Family History:    Family History  Problem Relation Age of Onset  . Diabetes Mellitus II Maternal Grandmother     ROS:  Please see the history of present illness.   All other ROS reviewed and negative.     Physical Exam/Data:   Vitals:   09/02/20 0100  09/02/20 0309 09/02/20 0449 09/02/20 0857  BP: (!) 180/87 (!) 173/95 (!) 140/94 (!) 144/94  Pulse: 86 65 (!) 105 100  Resp: 13 16 16 20   Temp:  98.3 F (36.8 C) 97.7 F (36.5 C) 98 F (36.7 C)  TempSrc:  Oral Axillary Oral  SpO2: 100% 98% 97% 100%  Weight:   116.3 kg   Height:   6\' 1"  (1.854 m)     Intake/Output Summary (Last 24 hours) at 09/02/2020 0935 Last data filed at 09/02/2020 0650 Gross per 24 hour  Intake 110.6 ml  Output 1700 ml  Net -1589.4 ml   Last 3 Weights  09/02/2020 08/31/2020 02/06/2019  Weight (lbs) 256 lb 6.3 oz 260 lb 275 lb  Weight (kg) 116.3 kg 117.935 kg 124.739 kg     Body mass index is 33.83 kg/m.   VITAL SIGNS:  reviewed GEN:  no acute distress NEURO:  alert and oriented x 3, no obvious focal deficit PSYCH:  normal affect  Telemetry:  Telemetry was personally reviewed and demonstrates: 09/02/2020 AF with rates in the 90-100's   Relevant CV Studies:  Lexiscan stress test 11/30/2018:  IMPRESSION:  1. No reversible ischemia or infarction.   2. Normal left ventricular wall motion.   3. Left ventricular ejection fraction 49%   4. Non invasive risk stratification*: Low     Laboratory Data:  Chemistry Recent Labs  Lab 08/31/20 1610 09/02/20 0739  NA 134* 135  K 3.8 3.7  CL 92* 97*  CO2 30 26  GLUCOSE 158* 226*  BUN 34* 52*  CREATININE 4.18* 4.17*  CALCIUM 8.0* 7.6*  GFRNONAA 16* 16*  ANIONGAP 12 12    Recent Labs  Lab 08/31/20 1610 09/02/20 0739  PROT 5.8* 5.6*  ALBUMIN 2.4* 2.2*  AST 65* 27  ALT 21 16  ALKPHOS 156* 142*  BILITOT 0.3 0.4   Hematology Recent Labs  Lab 08/31/20 1610 09/02/20 0739  WBC 4.3 2.8*  RBC 2.58* 2.68*  HGB 8.7* 9.1*  HCT 25.8* 27.2*  MCV 100.0 101.5*  MCH 33.7 34.0  MCHC 33.7 33.5  RDW 13.2 13.1  PLT 171 173   Cardiac EnzymesNo results for input(s): TROPONINI in the last 168 hours. No results for input(s): TROPIPOC in the last 168 hours.  BNP Recent Labs  Lab 09/02/20 0739  BNP 780.0*     DDimer  Recent Labs  Lab 08/31/20 2230 09/02/20 0739  DDIMER 0.49 0.28    Radiology/Studies:  DG Chest Portable 1 View  Result Date: 08/31/2020 CLINICAL DATA:  Shortness of breath. EXAM: PORTABLE CHEST 1 VIEW COMPARISON:  November 30, 2018 FINDINGS: The heart size and mediastinal contours are within normal limits. Both lungs are clear. No pneumothorax or pleural effusion is noted. The visualized skeletal structures are unremarkable. IMPRESSION: No active disease. Electronically Signed   By: Marijo Conception M.D.   On: 08/31/2020 17:06   Assessment and Plan:   1. New onset atrial fibrillation: -Pt presented to [facility] with c/o poor appetite, lethargy, N/V and diarrhea. He was found to be quite hypoxic which stabilized with supplemental O2. COVID testing returned as positive. Creatinine was elevated at 4.18. D-Dimer at 1.49 with CRP at 1.6. He was started on decadron and Remdesivir and was transferred to [facility]. -In route to [facility], he developed atrial fibrillation with RVR and was given 1 dose of metoprolol 5mg  IV. He remains in AF with rates in the low 100bpm range.  -CHA2DS2VASc= (HTN, vascular disease, DM). If no further procedures are planned, will transition over to Eliquis for stroke prevention. Will stop Plavix in the setting of Eliquis and continue ASA. Continue with rate control with beta blocker therapy.Would recommend echocardiogram to assess LV function. If he does not spontaneously convert, may require post acute illness DCCV after adequate uninterrupted anticoagulation in 3-4 weeks.  He recently underwent Lexiscan stress testing 11/2018 which was low risk with no evidence of ischemia or infarct.   2. Acute on chronic kidney disease stage IV: -Follows with OP nephrology due to rising creatinine  -Continue with current regimen -Avoid nephrotoxic medications -Management per IM   3. HTN: -Elevated, 140/94>>173/95>>180/87 -  PTA amlodipine and beta blocker -Will up titrate due to poor BP  control -Will likely require further antihypertensives   4. DM2: -SSI for glucose contriol while inpatient status  5. Covid 19-infection: -Pt presented with fatigue, poor appetite and weight loss>>found to be COVID positive  -Contine management per primary team   6. PVD: -s/p left femopopliteal (2003) and stenting (2018) -Continue ASA, statin therapy    For questions or updates, please contact [place] Please consult [email or web address] for contact info under    Signed, Kathyrn Drown, NP  09/02/2020 9:35 AM     Patient seen and examined and agree with Kathyrn Drown, NP as detailed above.  In brief, the patient is a 55 year old male with history of HTN, HLD, obesity, GERD, PVD s/p left femoropopliteal bypass (2003) with stenting (2018), DM2, Acute on chronic CKD stage IV from diabetic nephropathy followed by nephrology who is being seen today for the evaluation of atrial fibrillation.   Patient presented with worsening hypoxia found to be COVID positive. Course complicated by newly diagnosed Afib with RVR for which we have been consulted. Rates currently much better controlled in 100s on metop 100mg  daily. CHADs-vasc 3. Patient denies any current chest pain or palpitations.  Exam: GEN: No acute distress.   Neck: No JVD Cardiac: Irregularly irregular no murmurs, rubs, or gallops.  Respiratory: Clear to auscultation bilaterally. GI: Soft, nontender, non-distended  MS: No edema; No deformity. Neuro:  Nonfocal  Psych: Normal affect   Plan: -Continue metop 100mg  for rate control -Transition from heparin gtt to apixaban for [facility] for CHADs-vasc 3 -Stop plavix and continue apixaban/ASA -Agree with PPI for gastric protection -Follow-up TTE; if normal EF, can add dilt if needed for better rate control (would need to stop amlodipine if start dilt) -If patient remains in Afib, can proceed with DCCV after 3 weeks of AC -Continue ASA and statin for PVD -Management of Covid PNA and AKI  on CKD per primary team  Gwyndolyn Kaufman, MD

## 2020-09-02 NOTE — Progress Notes (Signed)
PROGRESS NOTE    Jamie Pittman  ZOX:[id number] DOB: [birth year] DOA: 08/31/2020 PCP: Robyne Peers, MD   Brief Narrative:  Jamie Pittman is a 55 y.o. male with history of diabetes mellitus type 2, peripheral vascular disease status post femoropopliteal bypass on the left lower extremity with stenting, chronic kidney disease stage IV, hypertension, anemia has been having progressive worsening kidney disease over the last few months.  In May 2021 patient's creatinine was around 2 and when patient follows up with his primary care physician in December creatinine was found to be around 3.9 and was referred to a nephrologist and his creatinine was further worsening around 4.2.  At this point patient was referred to the ER.  Patient states last few days he has been a poor appetite and lethargic had some nausea denies vomiting or diarrhea.  Denies fever chills. In the ER patient was initially found to be hypoxic requiring 2 L oxygen to maintain sats Covid test came back positive.  Chest x-ray was unremarkable; while being transferred patient went into A. fib with RVR was given 1 dose of metoprolol 5 mg IV.  Assessment & Plan:   Principal Problem:   Acute hypoxemic respiratory failure due to COVID-19 [facility]) Active Problems:   Renal failure (ARF), acute on chronic (HCC)   Tachycardia   Type 2 diabetes mellitus with vascular disease (HCC)   Essential hypertension   Acute respiratory failure due to COVID-19 [facility])   Acute hypoxic respiratory failure in the setting of COVID-19 pneumonia, POA SpO2: 99 % O2 Flow Rate (L/min): 2 L/min Patient remains mildly hypoxic, continue oxygen weaning and oxygen screening with ambulation Continue supportive care, continue Remdesivir, Decadron Hold Actemra/baricitinib in the setting of questionable bacteremia as below Inflammatory markers low and downtrending appropriately  Acute provoked A. fib with RVR  Currently rate controlled, cardiology following  appreciate insight and recommendations Given provoked nature with CHA2DS2-VASc of 2 unclear if patient will need lifelong anticoagulation versus short-term anticoagulation Repeat imaging per protocol, would likely benefit from Holter monitor at discharge to evaluate for burden of A. fib  Rule out bacteremia Blood culture x1 incidentally positive for staph epi, likely contaminant Follow repeat labs Patient received 1 dose of vancomycin in the ED at intake, hold further antibiotics and follow cultures  AKI on CKD4 Patient presents with creatinine elevation above 4, previous baseline more than 1 year ago seems to be 1.5; possibly progressive and advancing kidney disease Patient initially sent to the ED for worsening creatinine by nephrologist in the setting of poor p.o. intake and COVID-19 pneumonia this is certainly reasonable  No clear indication at this time to involve nephrology, continue to advance diet, no longer on IV fluids   Non-insulin-dependent diabetes type 2 Continue sliding scale insulin in the setting of steroids and acute illness as above  Chronic anemia chronic dz Continue to monitor morning labs  DVT prophylaxis:  Eliquis as initiated by cardiology Code Status: Full code. Family Communication: None available  Status is: Inpatient  Dispo: The patient is from: Home              Anticipated d/c is to: Home              Anticipated d/c date is: 24 to 48 hours              Patient currently NOT medically stable for discharge  Consultants:   Cardiology  Procedures:   None  Antimicrobials:  Remdesivir  Subjective: No acute issues  or events overnight, denies chest pain, nausea, vomiting, diarrhea, constipation, headache, fevers, chills.  Patient does endorse ongoing dyspnea with exertion but improving since admission.  Objective: Vitals:   09/01/20 2313 09/02/20 0100 09/02/20 0309 09/02/20 0449  BP: (!) 196/85 (!) 180/87 (!) 173/95 (!) 140/94  Pulse: 87 86 65  (!) 105  Resp: 18 13 16 16   Temp:   98.3 F (36.8 C) 97.7 F (36.5 C)  TempSrc:   Oral Axillary  SpO2: 100% 100% 98% 97%  Weight:    116.3 kg  Height:    6\' 1"  (1.854 m)    Intake/Output Summary (Last 24 hours) at 09/02/2020 0801 Last data filed at 09/02/2020 0650 Gross per 24 hour  Intake 110.6 ml  Output 1700 ml  Net -1589.4 ml   Filed Weights   08/31/20 1453 09/02/20 0449  Weight: 117.9 kg 116.3 kg    Examination:  General exam: Appears calm and comfortable  Respiratory system: Clear to auscultation. Respiratory effort normal. Cardiovascular system: S1 & S2 heard, RRR. No JVD, murmurs, rubs, gallops or clicks. No pedal edema. Gastrointestinal system: Abdomen is nondistended, soft and nontender. No organomegaly or masses felt. Normal bowel sounds heard. Central nervous system: Alert and oriented. No focal neurological deficits. Extremities: Symmetric 5 x 5 power. Skin: No rashes, lesions or ulcers Psychiatry: Judgement and insight appear normal. Mood & affect appropriate.     Data Reviewed: I have personally reviewed following labs and imaging studies  CBC: Recent Labs  Lab 08/31/20 1610  WBC 4.3  HGB 8.7*  HCT 25.8*  MCV 100.0  PLT 030   Basic Metabolic Panel: Recent Labs  Lab 08/31/20 1610  NA 134*  K 3.8  CL 92*  CO2 30  GLUCOSE 158*  BUN 34*  CREATININE 4.18*  CALCIUM 8.0*   GFR: Estimated Creatinine Clearance: 26.4 mL/min (A) (by C-G formula based on SCr of 4.18 mg/dL (H)). Liver Function Tests: Recent Labs  Lab 08/31/20 1610  AST 65*  ALT 21  ALKPHOS 156*  BILITOT 0.3  PROT 5.8*  ALBUMIN 2.4*   Recent Labs  Lab 08/31/20 1610  LIPASE 24   No results for input(s): AMMONIA in the last 168 hours. Coagulation Profile: No results for input(s): INR, PROTIME in the last 168 hours. Cardiac Enzymes: No results for input(s): CKTOTAL, CKMB, CKMBINDEX, TROPONINI in the last 168 hours. BNP (last 3 results) No results for input(s): PROBNP in  the last 8760 hours. HbA1C: No results for input(s): HGBA1C in the last 72 hours. CBG: No results for input(s): GLUCAP in the last 168 hours. Lipid Profile: Recent Labs    08/31/20 2230  TRIG 214*   Thyroid Function Tests: No results for input(s): TSH, T4TOTAL, FREET4, T3FREE, THYROIDAB in the last 72 hours. Anemia Panel: Recent Labs    08/31/20 2230  FERRITIN 127   Sepsis Labs: Recent Labs  Lab 08/31/20 2230  PROCALCITON 2.33  LATICACIDVEN 1.1    Recent Results (from the past 240 hour(s))  Resp Panel by RT-PCR (Flu A&B, Covid) Nasopharyngeal Swab     Status: Abnormal   Collection Time: 08/31/20  8:10 PM   Specimen: Nasopharyngeal Swab; Nasopharyngeal(NP) swabs in vial transport medium  Result Value Ref Range Status   SARS Coronavirus 2 by RT PCR POSITIVE (A) NEGATIVE Final    Comment: RESULT CALLED TO, READ BACK BY AND VERIFIED WITH: POWELL,V AT 2130 ON [id number] BY CHERESNOWSKY,T (NOTE) SARS-CoV-2 target nucleic acids are DETECTED.  The SARS-CoV-2 RNA is generally detectable  in upper respiratory specimens during the acute phase of infection. Positive results are indicative of the presence of the identified virus, but do not rule out bacterial infection or co-infection with other pathogens not detected by the test. Clinical correlation with patient history and other diagnostic information is necessary to determine patient infection status. The expected result is Negative.  Fact Sheet for Patients: EntrepreneurPulse.com.au  Fact Sheet for Healthcare Providers: IncredibleEmployment.be  This test is not yet approved or cleared by the [place] FDA and  has been authorized for detection and/or diagnosis of SARS-CoV-2 by FDA under an Emergency Use Authorization (EUA).  This EUA will remain in effect (meaning this  test can be used) for the duration of  the COVID-19 declaration under Section 564(b)(1) of the Act, 21 U.S.C.  section 360bbb-3(b)(1), unless the authorization is terminated or revoked sooner.     Influenza A by PCR NEGATIVE NEGATIVE Final   Influenza B by PCR NEGATIVE NEGATIVE Final    Comment: (NOTE) The Xpert Xpress SARS-CoV-2/FLU/RSV plus assay is intended as an aid in the diagnosis of influenza from Nasopharyngeal swab specimens and should not be used as a sole basis for treatment. Nasal washings and aspirates are unacceptable for Xpert Xpress SARS-CoV-2/FLU/RSV testing.  Fact Sheet for Patients: EntrepreneurPulse.com.au  Fact Sheet for Healthcare Providers: IncredibleEmployment.be  This test is not yet approved or cleared by the [place] FDA and has been authorized for detection and/or diagnosis of SARS-CoV-2 by FDA under an Emergency Use Authorization (EUA). This EUA will remain in effect (meaning this test can be used) for the duration of the COVID-19 declaration under Section 564(b)(1) of the Act, 21 U.S.C. section 360bbb-3(b)(1), unless the authorization is terminated or revoked.  Performed at [facility], [address] [place], [place] [address]   Blood Culture (routine x 2)     Status: None (Preliminary result)   Collection Time: 08/31/20 10:35 PM   Specimen: BLOOD  Result Value Ref Range Status   Specimen Description   Final    BLOOD RIGHT ANTECUBITAL Performed at [facility], [address] [place], [place] [address]    Special Requests   Final    BOTTLES DRAWN AEROBIC AND ANAEROBIC Blood Culture adequate volume Performed at [facility], [address] [place], [place] [address]    Culture  Setup Time   Final    GRAM POSITIVE COCCI IN CLUSTERS IN BOTH AEROBIC AND ANAEROBIC BOTTLES CRITICAL RESULT CALLED TO, READ BACK BY AND VERIFIED WITHSuezanne Jacquet RN [phone] 09/02/2020 Mena Goes Performed at [facility], [place] [address], [place], [place] [address]    Culture GRAM POSITIVE  COCCI  Final   Report Status PENDING  Incomplete  Blood Culture ID Panel (Reflexed)     Status: Abnormal   Collection Time: 08/31/20 10:35 PM  Result Value Ref Range Status   Enterococcus faecalis NOT DETECTED NOT DETECTED Final   Enterococcus Faecium NOT DETECTED NOT DETECTED Final   Listeria monocytogenes NOT DETECTED NOT DETECTED Final   Staphylococcus species DETECTED (A) NOT DETECTED Final    Comment: CRITICAL RESULT CALLED TO, READ BACK BY AND VERIFIED WITHSuezanne Jacquet RN [phone] 09/02/2020 T. TYSOR    Staphylococcus aureus (BCID) NOT DETECTED NOT DETECTED Final   Staphylococcus epidermidis DETECTED (A) NOT DETECTED Final    Comment: Methicillin (oxacillin) resistant coagulase negative staphylococcus. Possible blood culture contaminant (unless isolated from more than one blood culture draw or clinical  case suggests pathogenicity). No antibiotic treatment is indicated for blood  culture contaminants. CRITICAL RESULT CALLED TO, READ BACK BY AND VERIFIED WITH: Suezanne Jacquet RN [phone] 09/02/2020 T. TYSOR    Staphylococcus lugdunensis NOT DETECTED NOT DETECTED Final   Streptococcus species NOT DETECTED NOT DETECTED Final   Streptococcus agalactiae NOT DETECTED NOT DETECTED Final   Streptococcus pneumoniae NOT DETECTED NOT DETECTED Final   Streptococcus pyogenes NOT DETECTED NOT DETECTED Final   A.calcoaceticus-baumannii NOT DETECTED NOT DETECTED Final   Bacteroides fragilis NOT DETECTED NOT DETECTED Final   Enterobacterales NOT DETECTED NOT DETECTED Final   Enterobacter cloacae complex NOT DETECTED NOT DETECTED Final   Escherichia coli NOT DETECTED NOT DETECTED Final   Klebsiella aerogenes NOT DETECTED NOT DETECTED Final   Klebsiella oxytoca NOT DETECTED NOT DETECTED Final   Klebsiella pneumoniae NOT DETECTED NOT DETECTED Final   Proteus species NOT DETECTED NOT DETECTED Final   Salmonella species NOT DETECTED NOT DETECTED Final   Serratia marcescens NOT DETECTED NOT DETECTED  Final   Haemophilus influenzae NOT DETECTED NOT DETECTED Final   Neisseria meningitidis NOT DETECTED NOT DETECTED Final   Pseudomonas aeruginosa NOT DETECTED NOT DETECTED Final   Stenotrophomonas maltophilia NOT DETECTED NOT DETECTED Final   Candida albicans NOT DETECTED NOT DETECTED Final   Candida auris NOT DETECTED NOT DETECTED Final   Candida glabrata NOT DETECTED NOT DETECTED Final   Candida krusei NOT DETECTED NOT DETECTED Final   Candida parapsilosis NOT DETECTED NOT DETECTED Final   Candida tropicalis NOT DETECTED NOT DETECTED Final   Cryptococcus neoformans/gattii NOT DETECTED NOT DETECTED Final   Methicillin resistance mecA/C DETECTED (A) NOT DETECTED Final    Comment: CRITICAL RESULT CALLED TO, READ BACK BY AND VERIFIED WITHSuezanne Jacquet RN [phone] 09/02/2020 Mena Goes Performed at [facility], [address], [place], [place] [address]   Blood Culture (routine x 2)     Status: None (Preliminary result)   Collection Time: 09/01/20  5:59 AM   Specimen: BLOOD  Result Value Ref Range Status   Specimen Description   Final    BLOOD LEFT ANTECUBITAL Performed at [facility], [address] [place], [place] [address]    Special Requests   Final    BOTTLES DRAWN AEROBIC AND ANAEROBIC Blood Culture adequate volume Performed at [facility], [address] [place], [place] [address]    Culture  Setup Time   Final    GRAM POSITIVE COCCI IN CLUSTERS ANAEROBIC BOTTLE ONLY CRITICAL VALUE NOTED.  VALUE IS CONSISTENT WITH PREVIOUSLY REPORTED AND CALLED VALUE. Performed at [facility], [address], [place], [place] [address]    Culture GRAM POSITIVE COCCI  Final   Report Status PENDING  Incomplete         Radiology Studies: DG Chest Portable 1 View  Result Date: 08/31/2020 CLINICAL DATA:  Shortness of breath. EXAM: PORTABLE CHEST 1 VIEW COMPARISON:  November 30, 2018 FINDINGS: The heart size and mediastinal contours are within normal  limits. Both lungs are clear. No pneumothorax or pleural effusion is noted. The visualized skeletal structures are unremarkable. IMPRESSION: No active disease. Electronically Signed   By: Marijo Conception M.D.   On: 08/31/2020 17:06        Scheduled Meds: . amLODipine  10 mg Oral Daily  . vitamin C  500 mg Oral Daily  . aspirin EC  81 mg Oral Daily  . clopidogrel  75 mg Oral Daily  .  dexamethasone (DECADRON) injection  6 mg Intravenous Q24H  . ezetimibe  10 mg Oral Daily  . insulin aspart  0-9 Units Subcutaneous TID WC  . metoprolol succinate  75 mg Oral Daily  . pantoprazole  40 mg Oral Daily  . pravastatin  40 mg Oral q1800  . zinc sulfate  220 mg Oral Daily   Continuous Infusions: . heparin 1,600 Units/hr (09/02/20 0711)  . remdesivir 100 mg in NS 100 mL Stopped (09/01/20 0954)  . vancomycin 2,000 mg (09/02/20 0646)     LOS: 0 days    Time spent: 79min    Kattleya Kuhnert C Laylaa Guevarra, DO [facility]  If 7PM-7AM, please contact night-coverage [email or web address]  09/02/2020, 8:01 AM

## 2020-09-02 NOTE — ED Notes (Signed)
Dr. Dina Rich reviewed blood culture results. No new orders received.

## 2020-09-03 ENCOUNTER — Inpatient Hospital Stay (HOSPITAL_COMMUNITY): Payer: BC Managed Care – PPO

## 2020-09-03 DIAGNOSIS — I4891 Unspecified atrial fibrillation: Secondary | ICD-10-CM | POA: Diagnosis not present

## 2020-09-03 DIAGNOSIS — U071 COVID-19: Secondary | ICD-10-CM | POA: Diagnosis not present

## 2020-09-03 DIAGNOSIS — J9601 Acute respiratory failure with hypoxia: Secondary | ICD-10-CM | POA: Diagnosis not present

## 2020-09-03 LAB — CBC WITH DIFFERENTIAL/PLATELET
Abs Immature Granulocytes: 0.02 10*3/uL (ref 0.00–0.07)
Basophils Absolute: 0 10*3/uL (ref 0.0–0.1)
Basophils Relative: 0 %
Eosinophils Absolute: 0 10*3/uL (ref 0.0–0.5)
Eosinophils Relative: 0 %
HCT: 27.4 % — ABNORMAL LOW (ref 39.0–52.0)
Hemoglobin: 8.9 g/dL — ABNORMAL LOW (ref 13.0–17.0)
Immature Granulocytes: 1 %
Lymphocytes Relative: 22 %
Lymphs Abs: 0.6 10*3/uL — ABNORMAL LOW (ref 0.7–4.0)
MCH: 33.7 pg (ref 26.0–34.0)
MCHC: 32.5 g/dL (ref 30.0–36.0)
MCV: 103.8 fL — ABNORMAL HIGH (ref 80.0–100.0)
Monocytes Absolute: 0.2 10*3/uL (ref 0.1–1.0)
Monocytes Relative: 7 %
Neutro Abs: 1.9 10*3/uL (ref 1.7–7.7)
Neutrophils Relative %: 70 %
Platelets: 165 10*3/uL (ref 150–400)
RBC: 2.64 MIL/uL — ABNORMAL LOW (ref 4.22–5.81)
RDW: 13.2 % (ref 11.5–15.5)
WBC: 2.7 10*3/uL — ABNORMAL LOW (ref 4.0–10.5)
nRBC: 0 % (ref 0.0–0.2)

## 2020-09-03 LAB — COMPREHENSIVE METABOLIC PANEL
ALT: 14 U/L (ref 0–44)
AST: 22 U/L (ref 15–41)
Albumin: 2.2 g/dL — ABNORMAL LOW (ref 3.5–5.0)
Alkaline Phosphatase: 125 U/L (ref 38–126)
Anion gap: 11 (ref 5–15)
BUN: 58 mg/dL — ABNORMAL HIGH (ref 6–20)
CO2: 26 mmol/L (ref 22–32)
Calcium: 7.7 mg/dL — ABNORMAL LOW (ref 8.9–10.3)
Chloride: 100 mmol/L (ref 98–111)
Creatinine, Ser: 4.37 mg/dL — ABNORMAL HIGH (ref 0.61–1.24)
GFR, Estimated: 15 mL/min — ABNORMAL LOW (ref >60)
Glucose, Bld: 276 mg/dL — ABNORMAL HIGH (ref 70–99)
Potassium: 3.5 mmol/L (ref 3.5–5.1)
Sodium: 137 mmol/L (ref 135–145)
Total Bilirubin: 0.3 mg/dL (ref 0.3–1.2)
Total Protein: 5.4 g/dL — ABNORMAL LOW (ref 6.5–8.1)

## 2020-09-03 LAB — BASIC METABOLIC PANEL
Anion gap: 13 (ref 5–15)
BUN: 61 mg/dL — ABNORMAL HIGH (ref 6–20)
CO2: 25 mmol/L (ref 22–32)
Calcium: 7.7 mg/dL — ABNORMAL LOW (ref 8.9–10.3)
Chloride: 99 mmol/L (ref 98–111)
Creatinine, Ser: 3.66 mg/dL — ABNORMAL HIGH (ref 0.61–1.24)
GFR, Estimated: 19 mL/min — ABNORMAL LOW (ref >60)
Glucose, Bld: 210 mg/dL — ABNORMAL HIGH (ref 70–99)
Potassium: 3.2 mmol/L — ABNORMAL LOW (ref 3.5–5.1)
Sodium: 137 mmol/L (ref 135–145)

## 2020-09-03 LAB — ECHOCARDIOGRAM LIMITED
Area-P 1/2: 3.37 cm2
Height: 73 in
S' Lateral: 4.5 cm
Weight: 4145.6 oz

## 2020-09-03 LAB — C-REACTIVE PROTEIN: CRP: 0.8 mg/dL (ref <1.0)

## 2020-09-03 LAB — VANCOMYCIN, RANDOM: Vancomycin Rm: 21

## 2020-09-03 LAB — GLUCOSE, CAPILLARY
Glucose-Capillary: 238 mg/dL — ABNORMAL HIGH (ref 70–99)
Glucose-Capillary: 315 mg/dL — ABNORMAL HIGH (ref 70–99)

## 2020-09-03 MED ORDER — METOPROLOL SUCCINATE ER 50 MG PO TB24: 150.0000 mg | ORAL_TABLET | Freq: Every day | ORAL | Status: DC

## 2020-09-03 MED ORDER — VANCOMYCIN HCL 1250 MG/250ML IV SOLN
1250.0000 mg | INTRAVENOUS | Status: DC
  Filled 2020-09-03: qty 250

## 2020-09-03 MED ORDER — METOPROLOL SUCCINATE ER 50 MG PO TB24
50.0000 mg | ORAL_TABLET | Freq: Once | ORAL | Status: AC
  Administered 2020-09-03: 50 mg via ORAL
  Filled 2020-09-03: qty 1

## 2020-09-03 MED ORDER — METOPROLOL SUCCINATE ER 50 MG PO TB24: 150.0000 mg | ORAL_TABLET | Freq: Every day | ORAL | 0 refills | Status: AC

## 2020-09-03 MED ORDER — INSULIN ASPART 100 UNIT/ML ~~LOC~~ SOLN: 0.0000 [IU] | Freq: Every day | SUBCUTANEOUS | Status: DC

## 2020-09-03 MED ORDER — APIXABAN 5 MG PO TABS: 5.0000 mg | ORAL_TABLET | Freq: Two times a day (BID) | ORAL | 0 refills | Status: AC

## 2020-09-03 MED ORDER — LINEZOLID 600 MG PO TABS: 600.0000 mg | ORAL_TABLET | Freq: Two times a day (BID) | ORAL | 0 refills | Status: AC

## 2020-09-03 MED ORDER — PERFLUTREN LIPID MICROSPHERE
1.0000 mL | INTRAVENOUS | Status: AC | PRN
  Administered 2020-09-03: 2 mL via INTRAVENOUS
  Filled 2020-09-03: qty 10

## 2020-09-03 MED ORDER — INSULIN ASPART 100 UNIT/ML ~~LOC~~ SOLN: 0.0000 [IU] | Freq: Three times a day (TID) | SUBCUTANEOUS | Status: DC

## 2020-09-03 MED ORDER — LACTATED RINGERS IV BOLUS
500.0000 mL | Freq: Once | INTRAVENOUS | Status: AC
  Administered 2020-09-03: 500 mL via INTRAVENOUS

## 2020-09-03 NOTE — Progress Notes (Signed)
Inpatient Diabetes Program Recommendations  AACE/ADA: New Consensus Statement on Inpatient Glycemic Control (2015)  Target Ranges:  Prepandial:   less than 140 mg/dL      Peak postprandial:   less than 180 mg/dL (1-2 hours)      Critically ill patients:  140 - 180 mg/dL   Results for MOHIT, ZIRBES (MRN [id number]) as of 09/03/2020 08:49  Ref. Range 09/02/2020 08:16 09/02/2020 11:18 09/02/2020 16:03 09/02/2020 20:59  Glucose-Capillary Latest Ref Range: 70 - 99 mg/dL 222 (H)  3 units NOVOLOG  233 (H)  3 units NOVOLOG  220 (H)  3 units NOVOLOG  153 (H)   Results for NATHIN, SARAN (MRN [id number]) as of 09/03/2020 08:49  Ref. Range 09/03/2020 08:02  Glucose-Capillary Latest Ref Range: 70 - 99 mg/dL 238 (H)    Home DM Meds: Metformin 1000 mg BID  Current Orders: Novolog Sensitive Correction Scale/ SSI (0-9 units) TID AC    Decadron 6 mg Daily    MD- Note CBGs >200 likely due to Decadron  Please consider starting Levemir 11 units Daily (0.1 units/kg)    --Will follow patient during hospitalization--  Wyn Quaker RN, MSN, CDE Diabetes Coordinator Inpatient Glycemic Control Team Team Pager: [phone] (8a-5p)

## 2020-09-03 NOTE — Progress Notes (Addendum)
Progress Note  Patient Name: Jamie Pittman Date of Encounter: 09/03/2020  [facility] Cardiologist: No primary care provider on file.   Subjective   Feeling better. Not on oxygen.  Remains in Afib with HR 100s  Inpatient Medications    Scheduled Meds:  amLODipine  10 mg Oral Daily   apixaban  5 mg Oral BID   vitamin C  500 mg Oral Daily   aspirin EC  81 mg Oral Daily   B-complex with vitamin C  1 tablet Oral Daily   dexamethasone (DECADRON) injection  6 mg Intravenous Q24H   ezetimibe  10 mg Oral Daily   insulin aspart  0-9 Units Subcutaneous TID WC   metoprolol succinate  100 mg Oral Daily   pantoprazole  40 mg Oral Daily   pravastatin  40 mg Oral q1800   zinc sulfate  220 mg Oral Daily   Continuous Infusions:  remdesivir 100 mg in NS 100 mL 100 mg (09/03/20 1021)   vancomycin     PRN Meds: acetaminophen **OR** acetaminophen, traMADol   Vital Signs    Vitals:   09/02/20 1709 09/02/20 2100 09/03/20 0458 09/03/20 0500  BP: 130/84 (!) 150/86 135/87   Pulse: (!) 103 93 85   Resp: 18 19 19    Temp: 97.8 F (36.6 C) 98 F (36.7 C) 97.7 F (36.5 C)   TempSrc: Oral     SpO2: 100% 95% 100%   Weight:    117.5 kg  Height:       No intake or output data in the 24 hours ending 09/03/20 1051 Last 3 Weights 09/03/2020 09/02/2020 08/31/2020  Weight (lbs) 259 lb 1.6 oz 256 lb 6.3 oz 260 lb  Weight (kg) 117.527 kg 116.3 kg 117.935 kg      Telemetry    Afib with episodes of RVR up to 130s - Personally Reviewed  ECG    No new tracings - Personally Reviewed  Physical Exam   GEN: No acute distress.   Neck: No JVD Cardiac: Irregularly irregular, no murmurs, rubs, or gallops.  Respiratory: Clear to auscultation bilaterally. GI: Soft, nontender, non-distended  MS: No edema; No deformity. Neuro:  Nonfocal  Psych: Normal affect   Labs    High Sensitivity Troponin:   Recent Labs  Lab 08/31/20 1612 08/31/20 1822 09/02/20 0739  TROPONINIHS 41* 45* 35*       Chemistry Recent Labs  Lab 08/31/20 1610 09/02/20 0739 09/03/20 0354  NA 134* 135 137  K 3.8 3.7 3.5  CL 92* 97* 100  CO2 30 26 26   GLUCOSE 158* 226* 276*  BUN 34* 52* 58*  CREATININE 4.18* 4.17* 4.37*  CALCIUM 8.0* 7.6* 7.7*  PROT 5.8* 5.6* 5.4*  ALBUMIN 2.4* 2.2* 2.2*  AST 65* 27 22  ALT 21 16 14   ALKPHOS 156* 142* 125  BILITOT 0.3 0.4 0.3  GFRNONAA 16* 16* 15*  ANIONGAP 12 12 11      Hematology Recent Labs  Lab 08/31/20 1610 09/02/20 0739 09/03/20 0354  WBC 4.3 2.8* 2.7*  RBC 2.58* 2.68* 2.64*  HGB 8.7* 9.1* 8.9*  HCT 25.8* 27.2* 27.4*  MCV 100.0 101.5* 103.8*  MCH 33.7 34.0 33.7  MCHC 33.7 33.5 32.5  RDW 13.2 13.1 13.2  PLT 171 173 165    BNP Recent Labs  Lab 09/02/20 0739  BNP 780.0*     DDimer  Recent Labs  Lab 08/31/20 2230 09/02/20 0739  DDIMER 0.49 0.28     Radiology  DG Lumbar Spine 2-3 Views  Result Date: 09/02/2020 CLINICAL DATA:  Low back pain EXAM: LUMBAR SPINE - 2-3 VIEW COMPARISON:  Lumbar MRI April 23, 2020 FINDINGS: Frontal, lateral, and spot lumbosacral lateral images were obtained. There are 5 non-rib-bearing lumbar type vertebral bodies. No acute fracture or spondylolisthesis. There is moderately severe disc space narrowing at L4-5. Other disc spaces appear unremarkable. There is facet osteoarthritic change at L4-5 and L5-S1 bilaterally. There is aortic and iliac artery atherosclerotic calcification with a stent in the proximal common iliac artery on the left. IMPRESSION: Moderately severe disc space narrowing at L4-5. Other disc spaces appear unremarkable. No acute fracture or spondylolisthesis. Aortic Atherosclerosis (ICD10-I70.0). Atherosclerotic calcification in each iliac artery noted with stent in proximal left common iliac artery. Electronically Signed   By: Lowella Grip III M.D.   On: 09/02/2020 09:52    Cardiac Studies   Limited TTE pending  Lexiscan stress test 11/30/2018:  IMPRESSION:  1. No reversible  ischemia or infarction.   2. Normal left ventricular wall motion.   3. Left ventricular ejection fraction 49%   4. Non invasive risk stratification*: Low    Patient Profile     55 y.o. male year old male with history of HTN, HLD, obesity, GERD, PVD s/p left femoropopliteal bypass (2003) with stenting (2018), DM2, Acute on chronic CKD stage IV from diabetic nephropathy followed by nephrology who presented with COVID-19 pneumonia with course complicated by new Afib with RVR for which Cardiology has been consulted.  Assessment & Plan    #New Onset Paroxysmal Afib: CHADs-vasc 3. Newly diagnosed in the setting of COVD-19 infection. Initially in RVR now improved on metop 100mg  daily with HR 100s. Remains in Afib this morning. -Continue apixaban 5mg  BID for AC -Up-titrate metop to 150mg  daily -Follow-up TTE; if EF normal, can add dilt for better rate control -If remains in Afib, can plan on DCCV after 3 weeks of AC -Will need sleep study as out-patient  #Acute on chronic CKD stage IV: Cr 4.37 today. Has been rising as out-patient. Managed by nephrology. -Nephrology consulted -Renally dose medications -Avoid nephrotoxins  #Peripheral vascular disease: S/p left iliac stenting in 2003, angioplasty in 2018 as well as left fem-pop bypass. Followed by vascular surgery at [facility].  -Continue ASA and statin -Stop plavix now that he needs [facility] with apixaban -Follow-up with vascular surgery as scheduled  #COVID-19 infection: -Management per primary team  #HTN: -Continue amlodipine and metoprolol as above -If persistently elevated, can change metop to coreg for better BP control  #DMII: -SSI while in-patient  For questions or updates, please contact [facility] Please consult [email or web address] for contact info under        Signed, Freada Bergeron, MD  09/03/2020, 10:51 AM

## 2020-09-03 NOTE — Progress Notes (Addendum)
Pharmacy Antibiotic Note  Jamie Pittman is a 55 y.o. male admitted on 08/31/2020 with worsening renal failure/COVID-19.  Pharmacy has been consulted for vancomycin dosing. Admission blood cultures are growing MRSE from 2 different sets. Patient is afebrile with a normal white blood cell count. Lab is running susceptibilities on both sets of blood cultures to see if that staph epi matches. One dose of vancomycin 2 gm was given on 1/3 at 0646. Random level this morning was 21 at 0354 ~ 21 hours after last dose. Based on this it is predicted that level will drop to around 15 by 1800 this evening. Will re-dose conservatively and await further blood culture results. Ke= [phone] and Vd= 58.75. SCr up to 4.37 with CrCl ~ 25 ml/min. UOP not recorded over last 24 hours.   Plan: Vancomycin 1250 mg every 48 hours for now starting at 1800 Monitor blood cultures Consider potential oral agents pending susceptibilities   Height: 6\' 1"  (185.4 cm) Weight: 117.5 kg (259 lb 1.6 oz) IBW/kg (Calculated) : 79.9  Temp (24hrs), Avg:97.9 F (36.6 C), Min:97.7 F (36.5 C), Max:98.1 F (36.7 C)  Recent Labs  Lab 08/31/20 1610 08/31/20 2230 09/02/20 0739 09/03/20 0354  WBC 4.3  --  2.8* 2.7*  CREATININE 4.18*  --  4.17* 4.37*  LATICACIDVEN  --  1.1  --   --   VANCORANDOM  --   --   --  21    Estimated Creatinine Clearance: 25.3 mL/min (A) (by C-G formula based on SCr of 4.37 mg/dL (H)).    Allergies  Allergen Reactions  . Glipizide Itching    Other reaction(s): Other (See Comments) Headache Weight gain Headache Headache Weight gain   . Penicillins Itching    One time reaction   . Pioglitazone Itching    Other reaction(s): Other (See Comments) Weight gain Weight gain     Thank you for allowing pharmacy to be a part of this patient's care.  Jimmy Footman, PharmD, BCPS, BCIDP Infectious Diseases Clinical Pharmacist Phone: [phone] 09/03/2020 10:20 AM

## 2020-09-03 NOTE — Discharge Summary (Signed)
Physician Discharge Summary  Jamie Pittman HGD:[id number] DOB: 26-Jul-1966 DOA: 08/31/2020  PCP: Robyne Peers, MD  Admit date: 08/31/2020 Discharge date: 09/03/2020  Admitted From: Home Disposition:  Home  Recommendations for Outpatient Follow-up:  1. Follow up with PCP in 1-2 weeks 2. Please obtain BMP/CBC in one week 3. Please follow up on the following pending results:  Home Health: None  Equipment/Devices:None  Discharge Condition:Stable  CODE STATUS:Full  Diet recommendation: diabetic diet    Brief/Interim Summary: Jamie Pittman a 55 y.o.malewithhistory of diabetes mellitus type 2, peripheral vascular disease status post femoropopliteal bypass on the left lower extremity with stenting, chronic kidney disease stage IV, hypertension, anemia has been having progressive worsening kidney disease over the last few months. In May 2021 patient's creatinine was around 2 and when patient follows up with his primary care physician in December creatinine was found to be around 3.9 and was referred to a nephrologist and his creatinine was further worsening around 4.2. At this point patient was referred to the ER. Patient states last few days he has been a poor appetite and lethargic had some nausea denies vomiting or diarrhea. Denies fever chills.In the ER patient was initially found to be hypoxic requiring 2 L oxygen to maintain sats Covid test came back positive. Chest x-ray was unremarkable; while being transferred patient went into A. fib with RVR was given 1 dose of metoprolol 5 mg IV.  Patient sent to the ED for worsening creatinine subsequently noted to be transiently hypoxic with covid positive labs. Also noted to have transient afib with RVR that resolved after initiation of increased beta blockade. Oxygen weaned off within 24h of admission, heart rate now controlled even with exertion. Creatinine downtrending due to poor PO intake in the setting of presumed acute covid, but again  - he remains asymptomatic and non-hypoxic. Incidental blood cultures positive for staph epi - discharged on linezolid per pharmacy. Close follow up with PCP and nephrology next week or two as scheduled.  Discharge Diagnoses:  Principal Problem:   Acute hypoxemic respiratory failure due to COVID-19 [facility]) Active Problems:   Renal failure (ARF), acute on chronic (HCC)   Tachycardia   Type 2 diabetes mellitus with vascular disease (HCC)   Essential hypertension   Acute respiratory failure due to COVID-19 [facility])    Discharge Instructions  Discharge Instructions    Call MD for:  difficulty breathing, headache or visual disturbances   Complete by: As directed    Call MD for:  extreme fatigue   Complete by: As directed    Call MD for:  temperature >100.4   Complete by: As directed    Diet Carb Modified   Complete by: As directed    Increase activity slowly   Complete by: As directed      Allergies as of 09/03/2020      Reactions   Glipizide Itching   Other reaction(s): Other (See Comments) Headache Weight gain Headache Headache Weight gain   Penicillins Itching   One time reaction   Pioglitazone Itching   Other reaction(s): Other (See Comments) Weight gain Weight gain      Medication List    STOP taking these medications   Alka-Seltzer 325 MG Tbef tablet Generic drug: aspirin-sod bicarb-citric acid   clopidogrel 75 MG tablet Commonly known as: PLAVIX   cyclobenzaprine 5 MG tablet Commonly known as: FLEXERIL     TAKE these medications   acetaminophen 500 MG tablet Commonly known as: TYLENOL Take 500-1,000 mg by mouth  every 6 (six) hours as needed for mild pain.   amLODipine 10 MG tablet Commonly known as: NORVASC Take 10 mg by mouth daily.   apixaban 5 MG Tabs tablet Commonly known as: ELIQUIS Take 1 tablet (5 mg total) by mouth 2 (two) times daily.   aspirin EC 81 MG tablet Take 81 mg by mouth daily.   cholecalciferol 25 MCG (1000 UNIT) tablet Commonly  known as: VITAMIN D3 Take 1,000 Units by mouth daily.   ezetimibe 10 MG tablet Commonly known as: ZETIA Take 10 mg by mouth at bedtime.   gabapentin 600 MG tablet Commonly known as: NEURONTIN Take 600 mg by mouth 3 (three) times daily.   linezolid 600 MG tablet Commonly known as: ZYVOX Take 1 tablet (600 mg total) by mouth 2 (two) times daily.   loperamide 2 MG tablet Commonly known as: IMODIUM A-D Take 2 mg by mouth 4 (four) times daily as needed for diarrhea or loose stools.   magnesium oxide 400 MG tablet Commonly known as: MAG-OX Take 400 mg by mouth daily.   metFORMIN 1000 MG tablet Commonly known as: GLUCOPHAGE Take 1,000 mg by mouth 2 (two) times daily with a meal.   methocarbamol 500 MG tablet Commonly known as: ROBAXIN Take 1 tablet (500 mg total) by mouth every 8 (eight) hours as needed for muscle spasms.   metoprolol succinate 50 MG 24 hr tablet Commonly known as: TOPROL-XL Take 3 tablets (150 mg total) by mouth daily. Take with or immediately following a meal. Start taking on: September 04, 2020 What changed:   how much to take  when to take this  additional instructions   ondansetron 4 MG disintegrating tablet Commonly known as: ZOFRAN-ODT Take 1 tablet (4 mg total) by mouth every 8 (eight) hours as needed for nausea or vomiting.   pantoprazole 40 MG tablet Commonly known as: PROTONIX Take 40 mg by mouth daily.   pravastatin 40 MG tablet Commonly known as: PRAVACHOL Take 40 mg by mouth daily.   ProAir RespiClick 017 (90 Base) MCG/ACT Aepb Generic drug: Albuterol Sulfate Take 2 puffs by mouth every 6 (six) hours as needed (shortness of breath).   traMADol 50 MG tablet Commonly known as: ULTRAM Take 50 mg by mouth every 6 (six) hours as needed for moderate pain.       Follow-up Information    Robyne Peers, MD.   Specialty: Family Medicine Why: As needed Contact information: [phone] [place]  [address] [phone]              Allergies  Allergen Reactions  . Glipizide Itching    Other reaction(s): Other (See Comments) Headache Weight gain Headache Headache Weight gain   . Penicillins Itching    One time reaction   . Pioglitazone Itching    Other reaction(s): Other (See Comments) Weight gain Weight gain    Consultations:  Cardiology   Procedures/Studies: DG Lumbar Spine 2-3 Views  Result Date: 09/02/2020 CLINICAL DATA:  Low back pain EXAM: LUMBAR SPINE - 2-3 VIEW COMPARISON:  Lumbar MRI April 23, 2020 FINDINGS: Frontal, lateral, and spot lumbosacral lateral images were obtained. There are 5 non-rib-bearing lumbar type vertebral bodies. No acute fracture or spondylolisthesis. There is moderately severe disc space narrowing at L4-5. Other disc spaces appear unremarkable. There is facet osteoarthritic change at L4-5 and L5-S1 bilaterally. There is aortic and iliac artery atherosclerotic calcification with a stent in the proximal common iliac artery on the left. IMPRESSION:  Moderately severe disc space narrowing at L4-5. Other disc spaces appear unremarkable. No acute fracture or spondylolisthesis. Aortic Atherosclerosis (ICD10-I70.0). Atherosclerotic calcification in each iliac artery noted with stent in proximal left common iliac artery. Electronically Signed   By: Lowella Grip III M.D.   On: 09/02/2020 09:52   DG Chest Portable 1 View  Result Date: 08/31/2020 CLINICAL DATA:  Shortness of breath. EXAM: PORTABLE CHEST 1 VIEW COMPARISON:  November 30, 2018 FINDINGS: The heart size and mediastinal contours are within normal limits. Both lungs are clear. No pneumothorax or pleural effusion is noted. The visualized skeletal structures are unremarkable. IMPRESSION: No active disease. Electronically Signed   By: Marijo Conception M.D.   On: 08/31/2020 17:06   ECHOCARDIOGRAM LIMITED  Result Date: 09/03/2020    ECHOCARDIOGRAM LIMITED REPORT   Patient Name:   KRISTOFF COONRADT  Date of Exam: 09/03/2020 Medical Rec #:  [id number]      Height:       73.0 in Accession #:    [id number]     Weight:       259.1 lb Date of Birth:  1966/07/14       BSA:          2.402 m Patient Age:    55 years       BP:           135/87 mmHg Patient Gender: M              HR:           96 bpm. Exam Location:  Inpatient Procedure: Limited Echo, Limited Color Doppler, Cardiac Doppler and Intracardiac            Opacification Agent Indications:    I48.0 Paroxysmal atrial fibrillation  History:        Patient has no prior history of Echocardiogram examinations.                 Risk Factors:Diabetes, Hypertension and Dyslipidemia. Renal                 Disorder. COVID-19 Positive.  Sonographer:    Jonelle Sidle Dance Referring Phys: [id number] [place]  1. Left ventricular ejection fraction, by estimation, is 35 to 40%. The left ventricle has moderately decreased function. The left ventricle demonstrates global hypokinesis. There is mild concentric left ventricular hypertrophy. Left ventricular diastolic function could not be evaluated.  2. Right ventricular systolic function is normal. The right ventricular size is normal. Tricuspid regurgitation signal is inadequate for assessing PA pressure.  3. Left atrial size was mild to moderately dilated.  4. The mitral valve is grossly normal. Mild mitral valve regurgitation. No evidence of mitral stenosis.  5. The aortic valve is tricuspid. There is mild calcification of the aortic valve. There is mild thickening of the aortic valve. Aortic valve regurgitation is not visualized. Mild aortic valve sclerosis is present, with no evidence of aortic valve stenosis.  6. The inferior vena cava is normal in size with greater than 50% respiratory variability, suggesting right atrial pressure of 3 mmHg. FINDINGS  Left Ventricle: Left ventricular ejection fraction, by estimation, is 35 to 40%. The left ventricle has moderately decreased function. The left ventricle  demonstrates global hypokinesis. Definity contrast agent was given IV to delineate the left ventricular endocardial borders. The left ventricular internal cavity size was normal in size. There is mild concentric left ventricular hypertrophy. Left ventricular diastolic function could not be evaluated. Left ventricular diastolic function could  not be evaluated due to atrial fibrillation. Right Ventricle: The right ventricular size is normal. No increase in right ventricular wall thickness. Right ventricular systolic function is normal. Tricuspid regurgitation signal is inadequate for assessing PA pressure. Left Atrium: Left atrial size was mild to moderately dilated. Right Atrium: Right atrial size was normal in size. Pericardium: Trivial pericardial effusion is present. Presence of pericardial fat pad. Mitral Valve: The mitral valve is grossly normal. Mild mitral valve regurgitation. No evidence of mitral valve stenosis. Tricuspid Valve: The tricuspid valve is grossly normal. Tricuspid valve regurgitation is trivial. No evidence of tricuspid stenosis. Aortic Valve: The aortic valve is tricuspid. There is mild calcification of the aortic valve. There is mild thickening of the aortic valve. Aortic valve regurgitation is not visualized. Mild aortic valve sclerosis is present, with no evidence of aortic valve stenosis. Pulmonic Valve: The pulmonic valve was grossly normal. Pulmonic valve regurgitation is not visualized. No evidence of pulmonic stenosis. Aorta: The aortic root and ascending aorta are structurally normal, with no evidence of dilitation. Venous: The inferior vena cava is normal in size with greater than 50% respiratory variability, suggesting right atrial pressure of 3 mmHg. LEFT VENTRICLE PLAX 2D LVIDd:         5.50 cm LVIDs:         4.50 cm LV PW:         1.40 cm LV IVS:        1.20 cm LVOT diam:     2.00 cm LV SV:         39 LV SV Index:   16 LVOT Area:     3.14 cm  RIGHT VENTRICLE          IVC RV Basal  diam:  3.20 cm  IVC diam: 2.00 cm RV Mid diam:    2.30 cm LEFT ATRIUM              Index       RIGHT ATRIUM           Index LA diam:        4.50 cm  1.87 cm/m  RA Area:     16.10 cm LA Vol (A2C):   95.8 ml  39.89 ml/m RA Volume:   40.80 ml  16.99 ml/m LA Vol (A4C):   112.0 ml 46.63 ml/m LA Biplane Vol: 110.0 ml 45.80 ml/m  AORTIC VALVE LVOT Vmax:   62.50 cm/s LVOT Vmean:  39.550 cm/s LVOT VTI:    0.126 m  AORTA Ao Root diam: 3.50 cm Ao Asc diam:  3.40 cm MITRAL VALVE MV Area (PHT): 3.37 cm     SHUNTS MV Decel Time: 225 msec     Systemic VTI:  0.13 m MV E velocity: 118.50 cm/s  Systemic Diam: 2.00 cm Eleonore Chiquito MD Electronically signed by Eleonore Chiquito MD Signature Date/Time: 09/03/2020/1:45:10 PM    Final       Subjective: No acute issues/events overnight   Discharge Exam: Vitals:   09/03/20 0458 09/03/20 1401  BP: 135/87 (!) 157/109  Pulse: 85 88  Resp: 19 18  Temp: 97.7 F (36.5 C) 97.9 F (36.6 C)  SpO2: 100% 100%   Vitals:   09/02/20 2100 09/03/20 0458 09/03/20 0500 09/03/20 1401  BP: (!) 150/86 135/87  (!) 157/109  Pulse: 93 85  88  Resp: 19 19  18   Temp: 98 F (36.7 C) 97.7 F (36.5 C)  97.9 F (36.6 C)  TempSrc:    Oral  SpO2: 95% 100%  100%  Weight:   117.5 kg   Height:        General: Pt is alert, awake, not in acute distress Cardiovascular: RRR, S1/S2 +, no rubs, no gallops Respiratory: CTA bilaterally, no wheezing, no rhonchi Abdominal: Soft, NT, ND, bowel sounds + Extremities: no edema, no cyanosis    The results of significant diagnostics from this hospitalization (including imaging, microbiology, ancillary and laboratory) are listed below for reference.     Microbiology: Recent Results (from the past 240 hour(s))  Resp Panel by RT-PCR (Flu A&B, Covid) Nasopharyngeal Swab     Status: Abnormal   Collection Time: 08/31/20  8:10 PM   Specimen: Nasopharyngeal Swab; Nasopharyngeal(NP) swabs in vial transport medium  Result Value Ref Range Status    SARS Coronavirus 2 by RT PCR POSITIVE (A) NEGATIVE Final    Comment: RESULT CALLED TO, READ BACK BY AND VERIFIED WITH: POWELL,V AT 2130 ON [id number] BY CHERESNOWSKY,T (NOTE) SARS-CoV-2 target nucleic acids are DETECTED.  The SARS-CoV-2 RNA is generally detectable in upper respiratory specimens during the acute phase of infection. Positive results are indicative of the presence of the identified virus, but do not rule out bacterial infection or co-infection with other pathogens not detected by the test. Clinical correlation with patient history and other diagnostic information is necessary to determine patient infection status. The expected result is Negative.  Fact Sheet for Patients: EntrepreneurPulse.com.au  Fact Sheet for Healthcare Providers: IncredibleEmployment.be  This test is not yet approved or cleared by the [place] FDA and  has been authorized for detection and/or diagnosis of SARS-CoV-2 by FDA under an Emergency Use Authorization (EUA).  This EUA will remain in effect (meaning this  test can be used) for the duration of  the COVID-19 declaration under Section 564(b)(1) of the Act, 21 U.S.C. section 360bbb-3(b)(1), unless the authorization is terminated or revoked sooner.     Influenza A by PCR NEGATIVE NEGATIVE Final   Influenza B by PCR NEGATIVE NEGATIVE Final    Comment: (NOTE) The Xpert Xpress SARS-CoV-2/FLU/RSV plus assay is intended as an aid in the diagnosis of influenza from Nasopharyngeal swab specimens and should not be used as a sole basis for treatment. Nasal washings and aspirates are unacceptable for Xpert Xpress SARS-CoV-2/FLU/RSV testing.  Fact Sheet for Patients: EntrepreneurPulse.com.au  Fact Sheet for Healthcare Providers: IncredibleEmployment.be  This test is not yet approved or cleared by the [place] FDA and has been authorized for detection and/or diagnosis of  SARS-CoV-2 by FDA under an Emergency Use Authorization (EUA). This EUA will remain in effect (meaning this test can be used) for the duration of the COVID-19 declaration under Section 564(b)(1) of the Act, 21 U.S.C. section 360bbb-3(b)(1), unless the authorization is terminated or revoked.  Performed at [facility], [address] [place], [place] [address]   Blood Culture (routine x 2)     Status: Abnormal (Preliminary result)   Collection Time: 08/31/20 10:35 PM   Specimen: BLOOD  Result Value Ref Range Status   Specimen Description   Final    BLOOD RIGHT ANTECUBITAL Performed at [facility], [address] [place], [place] [address]    Special Requests   Final    BOTTLES DRAWN AEROBIC AND ANAEROBIC Blood Culture adequate volume Performed at [facility], [address] [place], [place] [address]    Culture  Setup Time   Final    GRAM POSITIVE COCCI IN CLUSTERS IN  BOTH AEROBIC AND ANAEROBIC BOTTLES CRITICAL RESULT CALLED TO, READ BACK BY AND VERIFIED WITH: Suezanne Jacquet RN [phone] 09/02/2020 T. TYSOR    Culture (A)  Final    STAPHYLOCOCCUS EPIDERMIDIS SUSCEPTIBILITIES TO FOLLOW Performed at [facility], [place] [address], [place], [place] [address]    Report Status PENDING  Incomplete  Blood Culture ID Panel (Reflexed)     Status: Abnormal   Collection Time: 08/31/20 10:35 PM  Result Value Ref Range Status   Enterococcus faecalis NOT DETECTED NOT DETECTED Final   Enterococcus Faecium NOT DETECTED NOT DETECTED Final   Listeria monocytogenes NOT DETECTED NOT DETECTED Final   Staphylococcus species DETECTED (A) NOT DETECTED Final    Comment: CRITICAL RESULT CALLED TO, READ BACK BY AND VERIFIED WITHSuezanne Jacquet RN [phone] 09/02/2020 T. TYSOR    Staphylococcus aureus (BCID) NOT DETECTED NOT DETECTED Final   Staphylococcus epidermidis DETECTED (A) NOT DETECTED Final    Comment: Methicillin (oxacillin) resistant coagulase negative  staphylococcus. Possible blood culture contaminant (unless isolated from more than one blood culture draw or clinical case suggests pathogenicity). No antibiotic treatment is indicated for blood  culture contaminants. CRITICAL RESULT CALLED TO, READ BACK BY AND VERIFIED WITH: Suezanne Jacquet RN [phone] 09/02/2020 T. TYSOR    Staphylococcus lugdunensis NOT DETECTED NOT DETECTED Final   Streptococcus species NOT DETECTED NOT DETECTED Final   Streptococcus agalactiae NOT DETECTED NOT DETECTED Final   Streptococcus pneumoniae NOT DETECTED NOT DETECTED Final   Streptococcus pyogenes NOT DETECTED NOT DETECTED Final   A.calcoaceticus-baumannii NOT DETECTED NOT DETECTED Final   Bacteroides fragilis NOT DETECTED NOT DETECTED Final   Enterobacterales NOT DETECTED NOT DETECTED Final   Enterobacter cloacae complex NOT DETECTED NOT DETECTED Final   Escherichia coli NOT DETECTED NOT DETECTED Final   Klebsiella aerogenes NOT DETECTED NOT DETECTED Final   Klebsiella oxytoca NOT DETECTED NOT DETECTED Final   Klebsiella pneumoniae NOT DETECTED NOT DETECTED Final   Proteus species NOT DETECTED NOT DETECTED Final   Salmonella species NOT DETECTED NOT DETECTED Final   Serratia marcescens NOT DETECTED NOT DETECTED Final   Haemophilus influenzae NOT DETECTED NOT DETECTED Final   Neisseria meningitidis NOT DETECTED NOT DETECTED Final   Pseudomonas aeruginosa NOT DETECTED NOT DETECTED Final   Stenotrophomonas maltophilia NOT DETECTED NOT DETECTED Final   Candida albicans NOT DETECTED NOT DETECTED Final   Candida auris NOT DETECTED NOT DETECTED Final   Candida glabrata NOT DETECTED NOT DETECTED Final   Candida krusei NOT DETECTED NOT DETECTED Final   Candida parapsilosis NOT DETECTED NOT DETECTED Final   Candida tropicalis NOT DETECTED NOT DETECTED Final   Cryptococcus neoformans/gattii NOT DETECTED NOT DETECTED Final   Methicillin resistance mecA/C DETECTED (A) NOT DETECTED Final    Comment: CRITICAL RESULT  CALLED TO, READ BACK BY AND VERIFIED WITHSuezanne Jacquet RN [phone] 09/02/2020 Mena Goes Performed at [facility], [address], [place], [place] [address]   Blood Culture (routine x 2)     Status: Abnormal (Preliminary result)   Collection Time: 09/01/20  5:59 AM   Specimen: BLOOD  Result Value Ref Range Status   Specimen Description   Final    BLOOD LEFT ANTECUBITAL Performed at [facility], [address] [place], [place] [address]    Special Requests   Final    BOTTLES DRAWN AEROBIC AND ANAEROBIC Blood Culture adequate volume Performed at [facility], [address] [place], [place] [address]  Culture  Setup Time   Final    GRAM POSITIVE COCCI IN CLUSTERS ANAEROBIC BOTTLE ONLY CRITICAL VALUE NOTED.  VALUE IS CONSISTENT WITH PREVIOUSLY REPORTED AND CALLED VALUE.    Culture (A)  Final    STAPHYLOCOCCUS EPIDERMIDIS SUSCEPTIBILITIES TO FOLLOW Performed at [facility], [place] [address], [place], [place] [address]    Report Status PENDING  Incomplete     Labs: BNP (last 3 results) Recent Labs    09/02/20 0739  BNP 782.4*   Basic Metabolic Panel: Recent Labs  Lab 08/31/20 1610 09/02/20 0739 09/03/20 0354 09/03/20 1344  NA 134* 135 137 137  K 3.8 3.7 3.5 3.2*  CL 92* 97* 100 99  CO2 30 26 26 25   GLUCOSE 158* 226* 276* 210*  BUN 34* 52* 58* 61*  CREATININE 4.18* 4.17* 4.37* 3.66*  CALCIUM 8.0* 7.6* 7.7* 7.7*   Liver Function Tests: Recent Labs  Lab 08/31/20 1610 09/02/20 0739 09/03/20 0354  AST 65* 27 22  ALT 21 16 14   ALKPHOS 156* 142* 125  BILITOT 0.3 0.4 0.3  PROT 5.8* 5.6* 5.4*  ALBUMIN 2.4* 2.2* 2.2*   Recent Labs  Lab 08/31/20 1610  LIPASE 24   No results for input(s): AMMONIA in the last 168 hours. CBC: Recent Labs  Lab 08/31/20 1610 09/02/20 0739 09/03/20 0354  WBC 4.3 2.8* 2.7*  NEUTROABS  --  2.1 1.9  HGB 8.7* 9.1* 8.9*  HCT 25.8* 27.2* 27.4*  MCV 100.0 101.5* 103.8*  PLT 171 173 165    Cardiac Enzymes: No results for input(s): CKTOTAL, CKMB, CKMBINDEX, TROPONINI in the last 168 hours. BNP: Invalid input(s): POCBNP CBG: Recent Labs  Lab 09/02/20 1118 09/02/20 1603 09/02/20 2059 09/03/20 0802 09/03/20 1112  GLUCAP 233* 220* 153* 238* 315*   D-Dimer Recent Labs    08/31/20 2230 09/02/20 0739  DDIMER 0.49 0.28   Hgb A1c No results for input(s): HGBA1C in the last 72 hours. Lipid Profile Recent Labs    08/31/20 2230  TRIG 214*   Thyroid function studies Recent Labs    09/02/20 0739  TSH 1.203   Anemia work up Recent Labs    08/31/20 2230  FERRITIN 127   Urinalysis    Component Value Date/Time   COLORURINE YELLOW 08/31/2020 1822   APPEARANCEUR CLEAR 08/31/2020 1822   LABSPEC 1.010 08/31/2020 1822   PHURINE 7.5 08/31/2020 1822   GLUCOSEU NEGATIVE 08/31/2020 1822   HGBUR TRACE (A) 08/31/2020 1822   BILIRUBINUR NEGATIVE 08/31/2020 1822   KETONESUR NEGATIVE 08/31/2020 1822   PROTEINUR >300 (A) 08/31/2020 1822   NITRITE NEGATIVE 08/31/2020 1822   LEUKOCYTESUR NEGATIVE 08/31/2020 1822   Sepsis Labs Invalid input(s): PROCALCITONIN,  WBC,  LACTICIDVEN Microbiology Recent Results (from the past 240 hour(s))  Resp Panel by RT-PCR (Flu A&B, Covid) Nasopharyngeal Swab     Status: Abnormal   Collection Time: 08/31/20  8:10 PM   Specimen: Nasopharyngeal Swab; Nasopharyngeal(NP) swabs in vial transport medium  Result Value Ref Range Status   SARS Coronavirus 2 by RT PCR POSITIVE (A) NEGATIVE Final    Comment: RESULT CALLED TO, READ BACK BY AND VERIFIED WITH: POWELL,V AT 2130 ON [id number] BY CHERESNOWSKY,T (NOTE) SARS-CoV-2 target nucleic acids are DETECTED.  The SARS-CoV-2 RNA is generally detectable in upper respiratory specimens during the acute phase of infection. Positive results are indicative of the presence of the identified virus, but do not rule out bacterial infection or co-infection with other pathogens not detected by the test.  Clinical correlation with patient history and other diagnostic information is necessary to determine patient infection status. The expected result is Negative.  Fact Sheet for Patients: EntrepreneurPulse.com.au  Fact Sheet for Healthcare Providers: IncredibleEmployment.be  This test is not yet approved or cleared by the [place] FDA and  has been authorized for detection and/or diagnosis of SARS-CoV-2 by FDA under an Emergency Use Authorization (EUA).  This EUA will remain in effect (meaning this  test can be used) for the duration of  the COVID-19 declaration under Section 564(b)(1) of the Act, 21 U.S.C. section 360bbb-3(b)(1), unless the authorization is terminated or revoked sooner.     Influenza A by PCR NEGATIVE NEGATIVE Final   Influenza B by PCR NEGATIVE NEGATIVE Final    Comment: (NOTE) The Xpert Xpress SARS-CoV-2/FLU/RSV plus assay is intended as an aid in the diagnosis of influenza from Nasopharyngeal swab specimens and should not be used as a sole basis for treatment. Nasal washings and aspirates are unacceptable for Xpert Xpress SARS-CoV-2/FLU/RSV testing.  Fact Sheet for Patients: EntrepreneurPulse.com.au  Fact Sheet for Healthcare Providers: IncredibleEmployment.be  This test is not yet approved or cleared by the [place] FDA and has been authorized for detection and/or diagnosis of SARS-CoV-2 by FDA under an Emergency Use Authorization (EUA). This EUA will remain in effect (meaning this test can be used) for the duration of the COVID-19 declaration under Section 564(b)(1) of the Act, 21 U.S.C. section 360bbb-3(b)(1), unless the authorization is terminated or revoked.  Performed at [facility], [address] [place], [place] [address]   Blood Culture (routine x 2)     Status: Abnormal (Preliminary result)   Collection Time: 08/31/20 10:35 PM   Specimen: BLOOD   Result Value Ref Range Status   Specimen Description   Final    BLOOD RIGHT ANTECUBITAL Performed at [facility], [address] [place], [place] [address]    Special Requests   Final    BOTTLES DRAWN AEROBIC AND ANAEROBIC Blood Culture adequate volume Performed at [facility], [address] [place], [place] [address]    Culture  Setup Time   Final    GRAM POSITIVE COCCI IN CLUSTERS IN BOTH AEROBIC AND ANAEROBIC BOTTLES CRITICAL RESULT CALLED TO, READ BACK BY AND VERIFIED WITHSuezanne Jacquet RN [phone] 09/02/2020 T. TYSOR    Culture (A)  Final    STAPHYLOCOCCUS EPIDERMIDIS SUSCEPTIBILITIES TO FOLLOW Performed at [facility], [place] [address] [place], [place] [address]    Report Status PENDING  Incomplete  Blood Culture ID Panel (Reflexed)     Status: Abnormal   Collection Time: 08/31/20 10:35 PM  Result Value Ref Range Status   Enterococcus faecalis NOT DETECTED NOT DETECTED Final   Enterococcus Faecium NOT DETECTED NOT DETECTED Final   Listeria monocytogenes NOT DETECTED NOT DETECTED Final   Staphylococcus species DETECTED (A) NOT DETECTED Final    Comment: CRITICAL RESULT CALLED TO, READ BACK BY AND VERIFIED WITHSuezanne Jacquet RN [phone] 09/02/2020 T. TYSOR    Staphylococcus aureus (BCID) NOT DETECTED NOT DETECTED Final   Staphylococcus epidermidis DETECTED (A) NOT DETECTED Final    Comment: Methicillin (oxacillin) resistant coagulase negative staphylococcus. Possible blood culture contaminant (unless isolated from more than one blood culture draw or clinical case suggests pathogenicity). No antibiotic treatment is indicated for blood  culture contaminants. CRITICAL RESULT CALLED TO, READ BACK BY AND VERIFIED WITHSuezanne Jacquet RN [phone] 09/02/2020 T. Rosalia Hammers  Staphylococcus lugdunensis NOT DETECTED NOT DETECTED Final   Streptococcus species NOT DETECTED NOT DETECTED Final   Streptococcus agalactiae NOT DETECTED NOT DETECTED Final    Streptococcus pneumoniae NOT DETECTED NOT DETECTED Final   Streptococcus pyogenes NOT DETECTED NOT DETECTED Final   A.calcoaceticus-baumannii NOT DETECTED NOT DETECTED Final   Bacteroides fragilis NOT DETECTED NOT DETECTED Final   Enterobacterales NOT DETECTED NOT DETECTED Final   Enterobacter cloacae complex NOT DETECTED NOT DETECTED Final   Escherichia coli NOT DETECTED NOT DETECTED Final   Klebsiella aerogenes NOT DETECTED NOT DETECTED Final   Klebsiella oxytoca NOT DETECTED NOT DETECTED Final   Klebsiella pneumoniae NOT DETECTED NOT DETECTED Final   Proteus species NOT DETECTED NOT DETECTED Final   Salmonella species NOT DETECTED NOT DETECTED Final   Serratia marcescens NOT DETECTED NOT DETECTED Final   Haemophilus influenzae NOT DETECTED NOT DETECTED Final   Neisseria meningitidis NOT DETECTED NOT DETECTED Final   Pseudomonas aeruginosa NOT DETECTED NOT DETECTED Final   Stenotrophomonas maltophilia NOT DETECTED NOT DETECTED Final   Candida albicans NOT DETECTED NOT DETECTED Final   Candida auris NOT DETECTED NOT DETECTED Final   Candida glabrata NOT DETECTED NOT DETECTED Final   Candida krusei NOT DETECTED NOT DETECTED Final   Candida parapsilosis NOT DETECTED NOT DETECTED Final   Candida tropicalis NOT DETECTED NOT DETECTED Final   Cryptococcus neoformans/gattii NOT DETECTED NOT DETECTED Final   Methicillin resistance mecA/C DETECTED (A) NOT DETECTED Final    Comment: CRITICAL RESULT CALLED TO, READ BACK BY AND VERIFIED WITHSuezanne Jacquet RN [phone] 09/02/2020 Mena Goes Performed at [facility], [address], [place], [place] [address]   Blood Culture (routine x 2)     Status: Abnormal (Preliminary result)   Collection Time: 09/01/20  5:59 AM   Specimen: BLOOD  Result Value Ref Range Status   Specimen Description   Final    BLOOD LEFT ANTECUBITAL Performed at [facility], [address] [place], [place] [address]    Special Requests   Final     BOTTLES DRAWN AEROBIC AND ANAEROBIC Blood Culture adequate volume Performed at [facility], [address] [place], [place] [address]    Culture  Setup Time   Final    GRAM POSITIVE COCCI IN CLUSTERS ANAEROBIC BOTTLE ONLY CRITICAL VALUE NOTED.  VALUE IS CONSISTENT WITH PREVIOUSLY REPORTED AND CALLED VALUE.    Culture (A)  Final    STAPHYLOCOCCUS EPIDERMIDIS SUSCEPTIBILITIES TO FOLLOW Performed at [facility], [place] [address] [place], [place] [address]    Report Status PENDING  Incomplete     Time coordinating discharge: Over 30 minutes  SIGNED:   Little Ishikawa, DO [facility] 09/03/2020, 4:06 PM Pager   If 7PM-7AM, please contact night-coverage [email or web address]

## 2020-09-03 NOTE — Plan of Care (Signed)

## 2020-09-03 NOTE — Plan of Care (Signed)
  Problem: Education: Goal: Knowledge of General Education information will improve Description: Including pain rating scale, medication(s)/side effects and non-pharmacologic comfort measures 09/03/2020 1550 by Rance Muir, RN Outcome: Adequate for Discharge 09/03/2020 1150 by Rance Muir, RN Outcome: Progressing   Problem: Health Behavior/Discharge Planning: Goal: Ability to manage health-related needs will improve 09/03/2020 1550 by Rance Muir, RN Outcome: Adequate for Discharge 09/03/2020 1150 by Rance Muir, RN Outcome: Progressing   Problem: Clinical Measurements: Goal: Ability to maintain clinical measurements within normal limits will improve 09/03/2020 1550 by Rance Muir, RN Outcome: Adequate for Discharge 09/03/2020 1150 by Rance Muir, RN Outcome: Progressing Goal: Will remain free from infection 09/03/2020 1550 by Rance Muir, RN Outcome: Adequate for Discharge 09/03/2020 1150 by Rance Muir, RN Outcome: Progressing Goal: Diagnostic test results will improve 09/03/2020 1550 by Rance Muir, RN Outcome: Adequate for Discharge 09/03/2020 1150 by Rance Muir, RN Outcome: Progressing Goal: Respiratory complications will improve 09/03/2020 1550 by Rance Muir, RN Outcome: Adequate for Discharge 09/03/2020 1150 by Rance Muir, RN Outcome: Progressing Goal: Cardiovascular complication will be avoided 09/03/2020 1550 by Rance Muir, RN Outcome: Adequate for Discharge 09/03/2020 1150 by Rance Muir, RN Outcome: Progressing   Problem: Activity: Goal: Risk for activity intolerance will decrease 09/03/2020 1550 by Rance Muir, RN Outcome: Adequate for Discharge 09/03/2020 1150 by Rance Muir, RN Outcome: Progressing   Problem: Nutrition: Goal: Adequate nutrition will be maintained 09/03/2020 1550 by Rance Muir, RN Outcome: Adequate for Discharge 09/03/2020 1150 by Rance Muir, RN Outcome: Progressing   Problem: Coping: Goal: Level of anxiety will decrease 09/03/2020 1550 by Rance Muir, RN Outcome: Adequate for Discharge 09/03/2020  1150 by Rance Muir, RN Outcome: Progressing   Problem: Elimination: Goal: Will not experience complications related to bowel motility 09/03/2020 1550 by Rance Muir, RN Outcome: Adequate for Discharge 09/03/2020 1150 by Rance Muir, RN Outcome: Progressing Goal: Will not experience complications related to urinary retention 09/03/2020 1550 by Rance Muir, RN Outcome: Adequate for Discharge 09/03/2020 1150 by Rance Muir, RN Outcome: Progressing   Problem: Pain Managment: Goal: General experience of comfort will improve Description: General experience of comfort will improve by 09/06/19 09/03/2020 1550 by Rance Muir, RN Outcome: Adequate for Discharge 09/03/2020 1150 by Rance Muir, RN Outcome: Progressing   Problem: Safety: Goal: Ability to remain free from injury will improve 09/03/2020 1550 by Rance Muir, RN Outcome: Adequate for Discharge 09/03/2020 1150 by Rance Muir, RN Outcome: Progressing   Problem: Skin Integrity: Goal: Risk for impaired skin integrity will decrease 09/03/2020 1550 by Rance Muir, RN Outcome: Adequate for Discharge 09/03/2020 1150 by Rance Muir, RN Outcome: Progressing

## 2020-09-03 NOTE — Progress Notes (Signed)
Discharge instructions given with stated understanding.  Patient waiting for transportation home at this time 

## 2020-09-03 NOTE — Progress Notes (Signed)
  Echocardiogram 2D Echocardiogram has been performed.  Delesa Kawa G Honora Searson 09/03/2020, 1:20 PM

## 2020-09-03 NOTE — Progress Notes (Signed)
SATURATION QUALIFICATIONS: (This note is used to comply with regulatory documentation for home oxygen)  Patient Saturations on Room Air at Rest = 100%  Patient Saturations on Room Air while Ambulating = 100%  Heart rate while ambulating was 90-103

## 2020-09-04 LAB — CULTURE, BLOOD (ROUTINE X 2)
Special Requests: ADEQUATE
Special Requests: ADEQUATE

## 2020-11-30 ENCOUNTER — Encounter (HOSPITAL_BASED_OUTPATIENT_CLINIC_OR_DEPARTMENT_OTHER): Payer: Self-pay | Admitting: Emergency Medicine

## 2020-11-30 ENCOUNTER — Other Ambulatory Visit: Payer: Self-pay

## 2020-11-30 ENCOUNTER — Emergency Department (HOSPITAL_BASED_OUTPATIENT_CLINIC_OR_DEPARTMENT_OTHER): Payer: BC Managed Care – PPO

## 2020-11-30 ENCOUNTER — Emergency Department (HOSPITAL_BASED_OUTPATIENT_CLINIC_OR_DEPARTMENT_OTHER)
Admission: EM | Admit: 2020-11-30 | Discharge: 2020-12-01 | Payer: BC Managed Care – PPO | Attending: Emergency Medicine | Admitting: Emergency Medicine

## 2020-11-30 DIAGNOSIS — E1122 Type 2 diabetes mellitus with diabetic chronic kidney disease: Secondary | ICD-10-CM | POA: Diagnosis not present

## 2020-11-30 DIAGNOSIS — Z8616 Personal history of COVID-19: Secondary | ICD-10-CM | POA: Diagnosis not present

## 2020-11-30 DIAGNOSIS — Z79899 Other long term (current) drug therapy: Secondary | ICD-10-CM | POA: Insufficient documentation

## 2020-11-30 DIAGNOSIS — J918 Pleural effusion in other conditions classified elsewhere: Secondary | ICD-10-CM | POA: Diagnosis not present

## 2020-11-30 DIAGNOSIS — N184 Chronic kidney disease, stage 4 (severe): Secondary | ICD-10-CM | POA: Insufficient documentation

## 2020-11-30 DIAGNOSIS — Z7901 Long term (current) use of anticoagulants: Secondary | ICD-10-CM | POA: Insufficient documentation

## 2020-11-30 DIAGNOSIS — J90 Pleural effusion, not elsewhere classified: Secondary | ICD-10-CM

## 2020-11-30 DIAGNOSIS — R109 Unspecified abdominal pain: Secondary | ICD-10-CM | POA: Diagnosis not present

## 2020-11-30 DIAGNOSIS — U071 COVID-19: Secondary | ICD-10-CM | POA: Diagnosis not present

## 2020-11-30 DIAGNOSIS — Z87891 Personal history of nicotine dependence: Secondary | ICD-10-CM | POA: Insufficient documentation

## 2020-11-30 DIAGNOSIS — Z7982 Long term (current) use of aspirin: Secondary | ICD-10-CM | POA: Insufficient documentation

## 2020-11-30 DIAGNOSIS — I129 Hypertensive chronic kidney disease with stage 1 through stage 4 chronic kidney disease, or unspecified chronic kidney disease: Secondary | ICD-10-CM | POA: Insufficient documentation

## 2020-11-30 DIAGNOSIS — R0602 Shortness of breath: Secondary | ICD-10-CM | POA: Diagnosis present

## 2020-11-30 DIAGNOSIS — R0902 Hypoxemia: Secondary | ICD-10-CM

## 2020-11-30 LAB — CBC WITH DIFFERENTIAL/PLATELET
Abs Immature Granulocytes: 0.01 10*3/uL (ref 0.00–0.07)
Basophils Absolute: 0.1 10*3/uL (ref 0.0–0.1)
Basophils Relative: 1 %
Eosinophils Absolute: 0.1 10*3/uL (ref 0.0–0.5)
Eosinophils Relative: 3 %
HCT: 27.5 % — ABNORMAL LOW (ref 39.0–52.0)
Hemoglobin: 9.3 g/dL — ABNORMAL LOW (ref 13.0–17.0)
Immature Granulocytes: 0 %
Lymphocytes Relative: 18 %
Lymphs Abs: 0.8 10*3/uL (ref 0.7–4.0)
MCH: 32.1 pg (ref 26.0–34.0)
MCHC: 33.8 g/dL (ref 30.0–36.0)
MCV: 94.8 fL (ref 80.0–100.0)
Monocytes Absolute: 0.3 10*3/uL (ref 0.1–1.0)
Monocytes Relative: 6 %
Neutro Abs: 3.4 10*3/uL (ref 1.7–7.7)
Neutrophils Relative %: 72 %
Platelets: 264 10*3/uL (ref 150–400)
RBC: 2.9 MIL/uL — ABNORMAL LOW (ref 4.22–5.81)
RDW: 13.9 % (ref 11.5–15.5)
WBC: 4.7 10*3/uL (ref 4.0–10.5)
nRBC: 0 % (ref 0.0–0.2)

## 2020-11-30 LAB — URINALYSIS, MICROSCOPIC (REFLEX)

## 2020-11-30 LAB — COMPREHENSIVE METABOLIC PANEL
ALT: 11 U/L (ref 0–44)
AST: 14 U/L — ABNORMAL LOW (ref 15–41)
Albumin: 2.2 g/dL — ABNORMAL LOW (ref 3.5–5.0)
Alkaline Phosphatase: 269 U/L — ABNORMAL HIGH (ref 38–126)
Anion gap: 13 (ref 5–15)
BUN: 31 mg/dL — ABNORMAL HIGH (ref 6–20)
CO2: 31 mmol/L (ref 22–32)
Calcium: 7.9 mg/dL — ABNORMAL LOW (ref 8.9–10.3)
Chloride: 84 mmol/L — ABNORMAL LOW (ref 98–111)
Creatinine, Ser: 3.7 mg/dL — ABNORMAL HIGH (ref 0.61–1.24)
GFR, Estimated: 18 mL/min — ABNORMAL LOW (ref >60)
Glucose, Bld: 112 mg/dL — ABNORMAL HIGH (ref 70–99)
Potassium: 3.8 mmol/L (ref 3.5–5.1)
Sodium: 128 mmol/L — ABNORMAL LOW (ref 135–145)
Total Bilirubin: 0.1 mg/dL — ABNORMAL LOW (ref 0.3–1.2)
Total Protein: 6.1 g/dL — ABNORMAL LOW (ref 6.5–8.1)

## 2020-11-30 LAB — RESP PANEL BY RT-PCR (FLU A&B, COVID) ARPGX2
Influenza A by PCR: NEGATIVE
Influenza B by PCR: NEGATIVE
SARS Coronavirus 2 by RT PCR: POSITIVE — AB

## 2020-11-30 LAB — URINALYSIS, ROUTINE W REFLEX MICROSCOPIC
Bilirubin Urine: NEGATIVE
Glucose, UA: NEGATIVE mg/dL
Ketones, ur: NEGATIVE mg/dL
Leukocytes,Ua: NEGATIVE
Nitrite: NEGATIVE
Protein, ur: >300 mg/dL — AB
Specific Gravity, Urine: 1.02 (ref 1.005–1.030)
pH: 7 (ref 5.0–8.0)

## 2020-11-30 LAB — LACTIC ACID, PLASMA
Lactic Acid, Venous: 2.6 mmol/L (ref 0.5–1.9)
Lactic Acid, Venous: 1 mmol/L (ref 0.5–1.9)

## 2020-11-30 LAB — LIPASE, BLOOD: Lipase: 31 U/L (ref 11–51)

## 2020-11-30 LAB — BRAIN NATRIURETIC PEPTIDE: B Natriuretic Peptide: 2115.2 pg/mL — ABNORMAL HIGH (ref 0.0–100.0)

## 2020-11-30 MED ORDER — MORPHINE SULFATE (PF) 4 MG/ML IV SOLN
4.0000 mg | Freq: Once | INTRAVENOUS | Status: AC
  Administered 2020-11-30: 4 mg via INTRAVENOUS
  Filled 2020-11-30: qty 1

## 2020-11-30 MED ORDER — FUROSEMIDE 10 MG/ML IJ SOLN
40.0000 mg | Freq: Once | INTRAMUSCULAR | Status: AC
  Administered 2020-11-30: 40 mg via INTRAVENOUS
  Filled 2020-11-30: qty 4

## 2020-11-30 MED ORDER — DEXAMETHASONE SODIUM PHOSPHATE 10 MG/ML IJ SOLN
10.0000 mg | Freq: Once | INTRAMUSCULAR | Status: AC
  Administered 2020-11-30: 10 mg via INTRAVENOUS
  Filled 2020-11-30: qty 1

## 2020-11-30 NOTE — ED Notes (Signed)
Covid Swab obtained and to the lab 

## 2020-11-30 NOTE — ED Notes (Signed)
Presents with feeling weak and SOB, prod cough with green thick sputum, has felt worse for the past 3 days. EDP at bedside.

## 2020-11-30 NOTE — ED Notes (Signed)
Pt requesting more pain medications, Dr. Billy Fischer notified.

## 2020-11-30 NOTE — ED Notes (Signed)
Patient transported to CT 

## 2020-11-30 NOTE — ED Provider Notes (Signed)
[place] [facility] EMERGENCY DEPARTMENT Provider Note   CSN: [id number] Arrival date & time: 11/30/20  1545     History Chief Complaint  Patient presents with  . Abdominal Pain    Abdulmalik Biesinger is a 55 y.o. male.  HPI      55 year old male with history of CKD stage IV, paroxysmal atrial fibrillation on Eliquis, COVID-19 infection in January 2022, peripheral arterial disease, diabetes, hypertension, hyperlipidemia, obesity, recent admission to [facility] regional hospital after mechanical fall resulting in right third through eighth rib fractures, with anasarca and bilateral pleural effusions who presents with concern for abdominal pain, constipation, found to be hypoxic to the 80s on arrival to the emergency department.  Reports 2-3 days of worsening shortness of breath and cough, cough productive of green sputum.  SOB worse with laying down. Also notes increasing leg swelling. No known fevers.  No chest pain. Reports some nausea, had vomited with coughing.  No diarrhea.   Past Medical History:  Diagnosis Date  . Back pain   . Diabetes mellitus without complication ([place])   . High cholesterol   . Hypertension   . Obesity   . Renal disorder     Patient Active Problem List   Diagnosis Date Noted  . Renal failure (ARF), acute on chronic ([place]) 09/02/2020  . Tachycardia 09/02/2020  . Type 2 diabetes mellitus with vascular disease ([place]) 09/02/2020  . Essential hypertension 09/02/2020  . Acute respiratory failure due to COVID-19 ([place]) 09/02/2020  . Acute hypoxemic respiratory failure due to COVID-19 [facility]) 08/31/2020    Past Surgical History:  Procedure Laterality Date  . BACK SURGERY    . CHOLECYSTECTOMY    . vascular stent leg         Family History  Problem Relation Age of Onset  . Diabetes Mellitus II Maternal Grandmother     Social History   Tobacco Use  . Smoking status: Former Research scientist (life sciences)  . Smokeless tobacco: Never Used  Substance Use Topics  . Alcohol  use: Yes    Alcohol/week: 1.0 standard drink    Types: 1 Shots of liquor per week    Comment: every other day  . Drug use: Never    Home Medications Prior to Admission medications   Medication Sig Start Date End Date Taking? Authorizing Provider  acetaminophen (TYLENOL) 500 MG tablet Take 500-1,000 mg by mouth every 6 (six) hours as needed for mild pain.    [provider]  amLODipine (NORVASC) 10 MG tablet Take 10 mg by mouth daily. 11/15/18   [provider]  apixaban (ELIQUIS) 5 MG TABS tablet Take 1 tablet (5 mg total) by mouth 2 (two) times daily. 09/03/20   Little Ishikawa, MD  aspirin EC 81 MG tablet Take 81 mg by mouth daily. 02/17/17   [provider]  cholecalciferol (VITAMIN D3) 25 MCG (1000 UNIT) tablet Take 1,000 Units by mouth daily.    [provider]  ezetimibe (ZETIA) 10 MG tablet Take 10 mg by mouth at bedtime. 10/12/18   [provider]  gabapentin (NEURONTIN) 600 MG tablet Take 600 mg by mouth 3 (three) times daily. 08/07/20   [provider]  linezolid (ZYVOX) 600 MG tablet Take 1 tablet (600 mg total) by mouth 2 (two) times daily. 09/03/20   Little Ishikawa, MD  loperamide (IMODIUM A-D) 2 MG tablet Take 2 mg by mouth 4 (four) times daily as needed for diarrhea or loose stools.    [provider]  magnesium oxide (MAG-OX) 400 MG tablet Take 400 mg by mouth daily.    [provider]  methocarbamol (ROBAXIN) 500 MG tablet Take 1 tablet (500 mg total) by mouth every 8 (eight) hours as needed for muscle spasms. 08/31/20   Davonna Belling, MD  metoprolol succinate (TOPROL-XL) 50 MG 24 hr tablet Take 3 tablets (150 mg total) by mouth daily. Take with or immediately following a meal. 09/04/20   Little Ishikawa, MD  ondansetron (ZOFRAN-ODT) 4 MG disintegrating tablet Take 1 tablet (4 mg total) by mouth every 8 (eight) hours as needed for nausea or vomiting. 08/31/20   Davonna Belling, MD  pantoprazole  (PROTONIX) 40 MG tablet Take 40 mg by mouth daily. 10/12/18 09/02/20  [provider]  PROAIR RESPICLICK 123XX123 (90 Base) MCG/ACT AEPB Take 2 puffs by mouth every 6 (six) hours as needed (shortness of breath). 11/24/18   [provider]  traMADol (ULTRAM) 50 MG tablet Take 50 mg by mouth every 6 (six) hours as needed for moderate pain. 07/15/20   [provider]    Allergies    Glipizide, Penicillins, and Pioglitazone  Review of Systems   Review of Systems  Constitutional: Positive for appetite change and fatigue. Negative for fever.  HENT: Positive for congestion. Negative for sore throat.   Eyes: Negative for visual disturbance.  Respiratory: Positive for cough and shortness of breath.   Cardiovascular: Negative for chest pain.  Gastrointestinal: Positive for abdominal pain and constipation. Negative for vomiting.  Genitourinary: Negative for difficulty urinating.  Musculoskeletal: Negative for back pain and neck stiffness.  Skin: Negative for rash.  Neurological: Negative for syncope and headaches.    Physical Exam Updated Vital Signs BP (!) 151/92   Pulse 93   Temp 98.3 F (36.8 C)   Resp 18   Ht '6\' 1"'$  (1.854 m)   Wt 118.8 kg   SpO2 97%   BMI 34.57 kg/m   Physical Exam Vitals and nursing note reviewed.  Constitutional:      General: He is not in acute distress.    Appearance: He is well-developed. He is not diaphoretic.  HENT:     Head: Normocephalic and atraumatic.  Eyes:     Conjunctiva/sclera: Conjunctivae normal.  Cardiovascular:     Rate and Rhythm: Normal rate and regular rhythm.     Heart sounds: Normal heart sounds. No murmur heard. No friction rub. No gallop.   Pulmonary:     Effort: Pulmonary effort is normal. No respiratory distress.     Breath sounds: Normal breath sounds. No wheezing or rales.  Abdominal:     General: There is no distension.     Palpations: Abdomen is soft.     Tenderness: There is no abdominal tenderness.  There is no guarding.  Musculoskeletal:     Cervical back: Normal range of motion.  Skin:    General: Skin is warm and dry.  Neurological:     Mental Status: He is alert and oriented to person, place, and time.     ED Results / Procedures / Treatments   Labs (all labs ordered are listed, but only abnormal results are displayed) Labs Reviewed  RESP PANEL BY RT-PCR (FLU A&B, COVID) ARPGX2 - Abnormal; Notable for the following components:      Result Value   SARS Coronavirus 2 by RT PCR POSITIVE (*)    All other components within normal limits  CBC WITH DIFFERENTIAL/PLATELET - Abnormal; Notable for the following components:   RBC  2.90 (*)    Hemoglobin 9.3 (*)    HCT 27.5 (*)    All other components within normal limits  COMPREHENSIVE METABOLIC PANEL - Abnormal; Notable for the following components:   Sodium 128 (*)    Chloride 84 (*)    Glucose, Bld 112 (*)    BUN 31 (*)    Creatinine, Ser 3.70 (*)    Calcium 7.9 (*)    Total Protein 6.1 (*)    Albumin 2.2 (*)    AST 14 (*)    Alkaline Phosphatase 269 (*)    Total Bilirubin 0.1 (*)    GFR, Estimated 18 (*)    All other components within normal limits  LACTIC ACID, PLASMA - Abnormal; Notable for the following components:   Lactic Acid, Venous 2.6 (*)    All other components within normal limits  BRAIN NATRIURETIC PEPTIDE - Abnormal; Notable for the following components:   B Natriuretic Peptide 2,115.2 (*)    All other components within normal limits  URINALYSIS, ROUTINE W REFLEX MICROSCOPIC - Abnormal; Notable for the following components:   Hgb urine dipstick TRACE (*)    Protein, ur >300 (*)    All other components within normal limits  URINALYSIS, MICROSCOPIC (REFLEX) - Abnormal; Notable for the following components:   Bacteria, UA FEW (*)    All other components within normal limits  CULTURE, BLOOD (ROUTINE X 2)  CULTURE, BLOOD (ROUTINE X 2)  URINE CULTURE  LACTIC ACID, PLASMA  LIPASE, BLOOD     EKG None  Radiology CT ABDOMEN PELVIS WO CONTRAST  Result Date: 11/30/2020 CLINICAL DATA:  Abdominal pain EXAM: CT ABDOMEN AND PELVIS WITHOUT CONTRAST TECHNIQUE: Multidetector CT imaging of the abdomen and pelvis was performed following the standard protocol without IV contrast. COMPARISON:  11/03/2020 FINDINGS: Lower chest: Large bilateral pleural effusions. Compressive atelectasis in the lower lobes. Diffuse coronary artery calcifications. Hepatobiliary: No focal hepatic abnormality.  Prior cholecystectomy Pancreas: No focal abnormality or ductal dilatation. Spleen: No focal abnormality.  Normal size. Adrenals/Urinary Tract: No adrenal abnormality. No focal renal abnormality. No stones or hydronephrosis. Urinary bladder is unremarkable. Extensive renovascular calcifications. Stomach/Bowel: Normal appendix. Stomach, large and small bowel grossly unremarkable. Vascular/Lymphatic: Diffuse aortoiliac and branch vessel calcifications. No evidence of aneurysm or adenopathy. Reproductive: No visible focal abnormality. Other: No free fluid or free air. Musculoskeletal: No acute bony abnormality. IMPRESSION: Large bilateral pleural effusions. Compressive atelectasis in the lower lobes. Age advanced diffuse coronary artery and aortic atherosclerosis. No acute findings in the abdomen or pelvis. Electronically Signed   By: Rolm Baptise M.D.   On: 11/30/2020 20:33   DG Chest Portable 1 View  Result Date: 11/30/2020 CLINICAL DATA:  Broken ribs.  Constipation. EXAM: PORTABLE CHEST 1 VIEW COMPARISON:  None. FINDINGS: Low lung volumes. Stable cardiac silhouette. Bilateral small effusions, LEFT greater than RIGHT. Rib fractures not appreciated. No pneumothorax. IMPRESSION: 1. Low lung volumes 2. Bilateral pleural effusions, LEFT greater than RIGHT. 3. No pneumothorax Electronically Signed   By: Suzy Bouchard M.D.   On: 11/30/2020 17:08    Procedures Procedures   Medications Ordered in ED Medications   morphine 4 MG/ML injection 4 mg (4 mg Intravenous Given 11/30/20 1815)  dexamethasone (DECADRON) injection 10 mg (10 mg Intravenous Given 11/30/20 2028)  furosemide (LASIX) injection 40 mg (40 mg Intravenous Given 11/30/20 2027)    ED Course  I have reviewed the triage vital signs and the nursing notes.  Pertinent labs & imaging results that were available  during my care of the patient were reviewed by me and considered in my medical decision making (see chart for details).    MDM Rules/Calculators/A&P                          55 year old male with history of CKD stage IV, paroxysmal atrial fibrillation on Eliquis, COVID-19 infection in January 2022, peripheral arterial disease, diabetes, hypertension, hyperlipidemia, obesity, recent admission to [facility] hospital after mechanical fall resulting in right third through eighth rib fractures, with anasarca and bilateral pleural effusions who presents with concern for abdominal pain, constipation, fatigue, shortness of breath and cough for last few days and found to be hypoxic to the 80s on arrival to the emergency department.  Differential diagnosis for dyspnea includes ACS, PE, COPD exacerbation, CHF exacerbation, anemia, pneumonia, viral etiology such as COVID 19 infection, metabolic abnormality.  Chest x-ray was done which showed bilateral pleural effusions. EKG was evaluated by me which showed no acute findings.  BNP was elevated to 2000s.  Doubt PE given he is on anticoagulation.  Ordered blood cx given possible pneumonia by history however no leukocytosis.  COVID 19 testing positive again, suspect new infection. Given decadron for COVID 19, and lasix with concern for some volume overload/CHF.    Pt requesting transfer to [facility] where he was recently admitted and admitted for further care.   Final Clinical Impression(s) / ED Diagnoses Final diagnoses:  Hypoxia  COVID-19  Bilateral pleural effusion    Rx / DC Orders ED Discharge  Orders    None       Gareth Morgan, MD 12/02/20 [phone]

## 2020-11-30 NOTE — ED Notes (Signed)
Pt given 2 bottles of Oral contrast to drink prior to CT scan, per radiology protocol; pt instructed to drink as much as he can over the 2 hour wait time for CT scan;   Approx scan time 7:45 pm

## 2020-11-30 NOTE — ED Notes (Signed)
Blood Cultures x 2 obtained prior to any meds given

## 2020-11-30 NOTE — ED Triage Notes (Addendum)
Reports recently put on pain meds for broken ribs.  Now hasn't had a bm for the last 5 days and haven't difficulty urinating for the last few days.  Also endorses swelling in lower ext and weakness.  O2 sat noted to be in mid 80's.  Reports productive cough with green phlegm

## 2020-12-01 ENCOUNTER — Telehealth: Payer: Self-pay | Admitting: Physician Assistant

## 2020-12-01 MED ORDER — FENTANYL CITRATE (PF) 100 MCG/2ML IJ SOLN: 100.0000 ug | Freq: Once | INTRAMUSCULAR | Status: DC

## 2020-12-01 NOTE — Telephone Encounter (Signed)
Called to discuss with patient about COVID-19 symptoms and the use of one of the available treatments for those with mild to moderate Covid symptoms and at a high risk of hospitalization.  Pt appears to qualify for outpatient treatment due to co-morbid conditions and/or a member of an at-risk group in accordance with the FDA Emergency Use Authorization.    Symptom onset: per ER notes,m sx onset ~ 3/31 Vaccinated: unknown Booster? unknown Immunocompromised? no Qualifiers: HTN, BMI, DM, CVD  Unable to reach pt - unclear if he was discharged - hypoxic. Called and left a VM   Angelena Form

## 2020-12-02 ENCOUNTER — Telehealth: Payer: Self-pay

## 2020-12-02 LAB — URINE CULTURE: Culture: NO GROWTH

## 2020-12-02 NOTE — Telephone Encounter (Signed)
Called to discuss with patient about COVID-19 symptoms and the use of one of the available treatments for those with mild to moderate Covid symptoms and at a high risk of hospitalization.  Pt appears to qualify for outpatient treatment due to co-morbid conditions and/or a member of an at-risk group in accordance with the FDA Emergency Use Authorization  Pt.'s wife states pt. Is currently hospitalized with COVID 19.   Jamie Pittman

## 2020-12-05 LAB — CULTURE, BLOOD (ROUTINE X 2)
Culture: NO GROWTH
Culture: NO GROWTH
Special Requests: ADEQUATE
Special Requests: ADEQUATE

## 2020-12-29 ENCOUNTER — Encounter (HOSPITAL_BASED_OUTPATIENT_CLINIC_OR_DEPARTMENT_OTHER): Payer: Self-pay

## 2020-12-29 ENCOUNTER — Emergency Department (HOSPITAL_BASED_OUTPATIENT_CLINIC_OR_DEPARTMENT_OTHER)
Admission: EM | Admit: 2020-12-29 | Discharge: 2020-12-29 | Disposition: A | Discharge status: Other Acute Inpatient Hospital | Payer: BC Managed Care – PPO | Attending: Emergency Medicine | Admitting: Emergency Medicine

## 2020-12-29 ENCOUNTER — Other Ambulatory Visit: Payer: Self-pay

## 2020-12-29 ENCOUNTER — Emergency Department (HOSPITAL_BASED_OUTPATIENT_CLINIC_OR_DEPARTMENT_OTHER): Payer: BC Managed Care – PPO

## 2020-12-29 DIAGNOSIS — I10 Essential (primary) hypertension: Secondary | ICD-10-CM | POA: Insufficient documentation

## 2020-12-29 DIAGNOSIS — Z20822 Contact with and (suspected) exposure to covid-19: Secondary | ICD-10-CM | POA: Insufficient documentation

## 2020-12-29 DIAGNOSIS — Z79899 Other long term (current) drug therapy: Secondary | ICD-10-CM | POA: Diagnosis not present

## 2020-12-29 DIAGNOSIS — E8770 Fluid overload, unspecified: Secondary | ICD-10-CM

## 2020-12-29 DIAGNOSIS — J9601 Acute respiratory failure with hypoxia: Secondary | ICD-10-CM | POA: Diagnosis not present

## 2020-12-29 DIAGNOSIS — E1159 Type 2 diabetes mellitus with other circulatory complications: Secondary | ICD-10-CM | POA: Insufficient documentation

## 2020-12-29 DIAGNOSIS — Z7982 Long term (current) use of aspirin: Secondary | ICD-10-CM | POA: Diagnosis not present

## 2020-12-29 DIAGNOSIS — R0602 Shortness of breath: Secondary | ICD-10-CM | POA: Diagnosis present

## 2020-12-29 DIAGNOSIS — Z87891 Personal history of nicotine dependence: Secondary | ICD-10-CM | POA: Diagnosis not present

## 2020-12-29 DIAGNOSIS — Z8616 Personal history of COVID-19: Secondary | ICD-10-CM | POA: Insufficient documentation

## 2020-12-29 DIAGNOSIS — Z7901 Long term (current) use of anticoagulants: Secondary | ICD-10-CM | POA: Insufficient documentation

## 2020-12-29 LAB — CBC WITH DIFFERENTIAL/PLATELET
Abs Immature Granulocytes: 0.02 10*3/uL (ref 0.00–0.07)
Basophils Absolute: 0.1 10*3/uL (ref 0.0–0.1)
Basophils Relative: 2 %
Eosinophils Absolute: 0.3 10*3/uL (ref 0.0–0.5)
Eosinophils Relative: 6 %
HCT: 25 % — ABNORMAL LOW (ref 39.0–52.0)
Hemoglobin: 7.9 g/dL — ABNORMAL LOW (ref 13.0–17.0)
Immature Granulocytes: 0 %
Lymphocytes Relative: 21 %
Lymphs Abs: 1.1 10*3/uL (ref 0.7–4.0)
MCH: 31 pg (ref 26.0–34.0)
MCHC: 31.6 g/dL (ref 30.0–36.0)
MCV: 98 fL (ref 80.0–100.0)
Monocytes Absolute: 0.5 10*3/uL (ref 0.1–1.0)
Monocytes Relative: 9 %
Neutro Abs: 3.1 10*3/uL (ref 1.7–7.7)
Neutrophils Relative %: 62 %
Platelets: 353 10*3/uL (ref 150–400)
RBC: 2.55 MIL/uL — ABNORMAL LOW (ref 4.22–5.81)
RDW: 14.3 % (ref 11.5–15.5)
WBC: 5.1 10*3/uL (ref 4.0–10.5)
nRBC: 0 % (ref 0.0–0.2)

## 2020-12-29 LAB — COMPREHENSIVE METABOLIC PANEL
ALT: 19 U/L (ref 0–44)
AST: 25 U/L (ref 15–41)
Albumin: 2.1 g/dL — ABNORMAL LOW (ref 3.5–5.0)
Alkaline Phosphatase: 192 U/L — ABNORMAL HIGH (ref 38–126)
Anion gap: 10 (ref 5–15)
BUN: 25 mg/dL — ABNORMAL HIGH (ref 6–20)
CO2: 33 mmol/L — ABNORMAL HIGH (ref 22–32)
Calcium: 8 mg/dL — ABNORMAL LOW (ref 8.9–10.3)
Chloride: 91 mmol/L — ABNORMAL LOW (ref 98–111)
Creatinine, Ser: 2.93 mg/dL — ABNORMAL HIGH (ref 0.61–1.24)
GFR, Estimated: 24 mL/min — ABNORMAL LOW (ref >60)
Glucose, Bld: 118 mg/dL — ABNORMAL HIGH (ref 70–99)
Potassium: 4 mmol/L (ref 3.5–5.1)
Sodium: 134 mmol/L — ABNORMAL LOW (ref 135–145)
Total Bilirubin: 0.2 mg/dL — ABNORMAL LOW (ref 0.3–1.2)
Total Protein: 5.2 g/dL — ABNORMAL LOW (ref 6.5–8.1)

## 2020-12-29 LAB — BRAIN NATRIURETIC PEPTIDE: B Natriuretic Peptide: 2198.3 pg/mL — ABNORMAL HIGH (ref 0.0–100.0)

## 2020-12-29 LAB — TROPONIN I (HIGH SENSITIVITY)
Troponin I (High Sensitivity): 8 ng/L (ref <18)
Troponin I (High Sensitivity): 8 ng/L (ref <18)

## 2020-12-29 MED ORDER — FUROSEMIDE 10 MG/ML IJ SOLN
40.0000 mg | Freq: Once | INTRAMUSCULAR | Status: AC
  Administered 2020-12-29: 40 mg via INTRAVENOUS
  Filled 2020-12-29: qty 4

## 2020-12-29 MED ORDER — TRAMADOL HCL 50 MG PO TABS
50.0000 mg | ORAL_TABLET | Freq: Once | ORAL | Status: AC
  Administered 2020-12-29: 50 mg via ORAL
  Filled 2020-12-29: qty 1

## 2020-12-29 NOTE — ED Notes (Signed)
RT at bedside to assess pt.

## 2020-12-29 NOTE — ED Notes (Signed)
Ambulated from triage to room 6, HR 90-98, SpO2 dropped to 84% on r/a. Placed on 2lpm [place] SpO2 now 97%

## 2020-12-29 NOTE — ED Notes (Addendum)
Xray at bedside for portable chest 

## 2020-12-29 NOTE — ED Provider Notes (Signed)
[facility] Provider Note   CSN: [id number] Arrival date & time: 12/29/20  1527     History Chief Complaint  Patient presents with  . Shortness of Breath    Jamie [facility] is a 55 y.o. male.   Shortness of Breath Severity:  Moderate Onset quality:  Sudden Timing:  Intermittent Progression:  Unchanged Chronicity:  Recurrent Context comment:  Recent PNA with effusions and admission Relieved by:  Oxygen Worsened by:  Exertion Ineffective treatments:  None tried Associated symptoms: no chest pain, no cough, no fever, no headaches, no rash and no vomiting        Past Medical History:  Diagnosis Date  . Back pain   . Diabetes mellitus without complication ([place])   . High cholesterol   . Hypertension   . Obesity   . Renal disorder     Patient Active Problem List   Diagnosis Date Noted  . Renal failure (ARF), acute on chronic ([place]) 09/02/2020  . Tachycardia 09/02/2020  . 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 Essential hypertension 09/02/2020  . Acute respiratory failure due to COVID-19 ([place]) 09/02/2020  . Acute hypoxemic respiratory failure due to COVID-19 [facility]) 08/31/2020    Past Surgical History:  Procedure Laterality Date  . BACK SURGERY    . CHOLECYSTECTOMY    . vascular stent leg         Family History  Problem Relation Age of Onset  . Diabetes Mellitus II Maternal Grandmother     Social History   Tobacco Use  . Smoking status: Former Research scientist (life sciences)  . Smokeless tobacco: Never Used  Substance Use Topics  . Alcohol use: Yes    Alcohol/week: 1.0 standard drink    Types: 1 Shots of liquor per week    Comment: every other day  . Drug use: Never    Home Medications Prior to Admission medications   Medication Sig Start Date End Date Taking? Authorizing Provider  acetaminophen (TYLENOL) 500 MG tablet Take 500-1,000 mg by mouth every 6 (six) hours as needed for mild pain.    [provider]  amLODipine (NORVASC) 10 MG tablet Take 10 mg by mouth daily. 11/15/18   [provider]  apixaban (ELIQUIS) 5 MG TABS tablet Take 1 tablet (5 mg total) by mouth 2 (two) times daily. 09/03/20   Little Ishikawa, MD  aspirin EC 81 MG tablet Take 81 mg by mouth daily. 02/17/17   [provider]  cholecalciferol (VITAMIN D3) 25 MCG (1000 UNIT) tablet Take 1,000 Units by mouth daily.    [provider]  ezetimibe (ZETIA) 10 MG tablet Take 10 mg by mouth at bedtime. 10/12/18   [provider]  gabapentin (NEURONTIN) 600 MG tablet Take 600 mg by mouth 3 (three) times daily. 08/07/20   [provider]  linezolid (ZYVOX) 600 MG tablet Take 1 tablet (600 mg total) by mouth 2 (two) times daily. 09/03/20   Little Ishikawa, MD  loperamide (IMODIUM A-D) 2 MG tablet Take 2 mg by mouth 4 (four) times daily as needed for diarrhea or loose stools.    [provider]  magnesium oxide (MAG-OX) 400 MG tablet Take 400 mg by mouth daily.    [provider]  methocarbamol (ROBAXIN) 500 MG tablet Take 1 tablet (500 mg total) by mouth every 8 (eight) hours as needed for muscle spasms. 08/31/20   Davonna Belling, MD  metoprolol succinate (TOPROL-XL) 50 MG 24 hr tablet  Take 3 tablets (150 mg total) by mouth daily. Take with or immediately following a meal. 09/04/20   Little Ishikawa, MD  ondansetron (ZOFRAN-ODT) 4 MG disintegrating tablet Take 1 tablet (4 mg total) by mouth every 8 (eight) hours as needed for nausea or vomiting. 08/31/20   Davonna Belling, MD  pantoprazole (PROTONIX) 40 MG tablet Take 40 mg by mouth daily. 10/12/18 09/02/20  [provider]  PROAIR RESPICLICK 123XX123 (90 Base) MCG/ACT AEPB Take 2 puffs by mouth every 6 (six) hours as needed (shortness of breath). 11/24/18   [provider]  traMADol (ULTRAM) 50 MG tablet Take 50 mg by mouth every 6 (six) hours as needed for moderate pain. 07/15/20   [provider]     Allergies    Glipizide, Penicillins, and Pioglitazone  Review of Systems   Review of Systems  Constitutional: Negative for chills and fever.  HENT: Negative for congestion and rhinorrhea.   Respiratory: Positive for shortness of breath. Negative for cough.   Cardiovascular: Negative for chest pain and palpitations.  Gastrointestinal: Negative for diarrhea, nausea and vomiting.  Genitourinary: Negative for difficulty urinating and dysuria.  Musculoskeletal: Negative for arthralgias and back pain.  Skin: Negative for color change and rash.  Neurological: Negative for light-headedness and headaches.    Physical Exam Updated Vital Signs BP 136/68   Pulse 81   Temp 97.7 F (36.5 C)   Resp 19   Ht '6\' 1"'$  (1.854 m)   Wt 104.3 kg   SpO2 97%   BMI 30.34 kg/m   Physical Exam Vitals and nursing note reviewed.  Constitutional:      General: He is not in acute distress.    Appearance: Normal appearance.  HENT:     Head: Normocephalic and atraumatic.     Nose: No rhinorrhea.  Eyes:     General:        Right eye: No discharge.        Left eye: No discharge.     Conjunctiva/sclera: Conjunctivae normal.  Cardiovascular:     Rate and Rhythm: Normal rate and regular rhythm.  Pulmonary:     Effort: Pulmonary effort is normal.     Breath sounds: No stridor. Examination of the right-lower field reveals decreased breath sounds. Examination of the left-lower field reveals decreased breath sounds. Decreased breath sounds present.  Chest:     Chest wall: No mass or tenderness.  Abdominal:     General: Abdomen is flat. There is no distension.     Palpations: Abdomen is soft.  Musculoskeletal:        General: No deformity or signs of injury.     Right lower leg: No edema.     Left lower leg: No edema.  Skin:    General: Skin is warm and dry.     Capillary Refill: Capillary refill takes less than 2 seconds.  Neurological:     General: No focal deficit present.     Mental Status:  He is alert. Mental status is at baseline.     Motor: No weakness.  Psychiatric:        Mood and Affect: Mood normal.        Behavior: Behavior normal.        Thought Content: Thought content normal.     ED Results / Procedures / Treatments   Labs (all labs ordered are listed, but only abnormal results are displayed) Labs Reviewed  CBC WITH DIFFERENTIAL/PLATELET - Abnormal; Notable for the following  components:      Result Value   RBC 2.55 (*)    Hemoglobin 7.9 (*)    HCT 25.0 (*)    All other components within normal limits  COMPREHENSIVE METABOLIC PANEL - Abnormal; Notable for the following components:   Sodium 134 (*)    Chloride 91 (*)    CO2 33 (*)    Glucose, Bld 118 (*)    BUN 25 (*)    Creatinine, Ser 2.93 (*)    Calcium 8.0 (*)    Total Protein 5.2 (*)    Albumin 2.1 (*)    Alkaline Phosphatase 192 (*)    Total Bilirubin 0.2 (*)    GFR, Estimated 24 (*)    All other components within normal limits  BRAIN NATRIURETIC PEPTIDE - Abnormal; Notable for the following components:   B Natriuretic Peptide 2,198.3 (*)    All other components within normal limits  SARS CORONAVIRUS 2 (TAT 6-24 HRS)  TROPONIN I (HIGH SENSITIVITY)  TROPONIN I (HIGH SENSITIVITY)    EKG EKG Interpretation  Date/Time:  Sunday Dec 29 2020 15:38:58 EDT Ventricular Rate:  79 PR Interval:  176 QRS Duration: 100 QT Interval:  410 QTC Calculation: 470 R Axis:   35 Text Interpretation: Normal sinus rhythm Nonspecific T wave abnormality Prolonged QT Abnormal ECG Confirmed by Dewaine Conger [phone]) on 12/29/2020 5:26:58 PM   Radiology DG Chest Port 1 View  Result Date: 12/29/2020 CLINICAL DATA:  Shortness of breath.  COVID positive. EXAM: PORTABLE CHEST 1 VIEW COMPARISON:  12/16/2020 FINDINGS: Stable to slightly increasing bilateral LOWER lung opacities noted. Probable trace pleural effusions are noted. Cardiomediastinal silhouette is unchanged. No pneumothorax or acute bony abnormality. IMPRESSION:  Stable to slightly increasing bilateral LOWER lung opacities likely representing a combination of pneumonia and atelectasis. Probable trace pleural effusions. Electronically Signed   By: Margarette Canada M.D.   On: 12/29/2020 16:50    Procedures Procedures   Medications Ordered in ED Medications  furosemide (LASIX) injection 40 mg (40 mg Intravenous Given 12/29/20 1739)    ED Course  I have reviewed the triage vital signs and the nursing notes.  Pertinent labs & imaging results that were available during my care of the patient were reviewed by me and considered in my medical decision making (see chart for details).    MDM Rules/Calculators/A&P                          Concern for recurrence of disease.  Recent admission for effusions and pneumonia.  Will get x-ray screening labs.  EKG done shows sinus rhythm with no acute ischemic change or bladder Mally or arrhythmia.  Patient's labs show an elevated BNP consistent with previous.  Improving kidney function.  Chest x-ray reviewed by radiology myself shows possible worsening disease likely fluid in the setting of this patient's history of heart failure.  Will give diuretic and he will need admission for acute hypoxic respiratory failure.  I consulted the hospitalist at [facility] as this is his preferred location.  They agree to accept. Final Clinical Impression(s) / ED Diagnoses Final diagnoses:  Acute hypoxemic respiratory failure (HCC)  Hypervolemia, unspecified hypervolemia type    Rx / DC Orders ED Discharge Orders    None       Breck Coons, MD 12/29/20 1810

## 2020-12-29 NOTE — ED Triage Notes (Addendum)
Pt has SOB intermittently for the past month, was admitted for pneumonia 4/2. SOB and chest pain worsening today.  SpO2 86% on RA at arrival. Increased to 94% after sitting for a few minutes.

## 2020-12-30 LAB — SARS CORONAVIRUS 2 (TAT 6-24 HRS): SARS Coronavirus 2: NEGATIVE

## 2022-10-01 IMAGING — CT CT ABD-PELV W/O CM
2 of 4 series · 16 of 46 positions shown, 18 images · non-contrast
Comparison: 11/03/2020

CLINICAL DATA: Abdominal pain

EXAM:
CT ABDOMEN AND PELVIS WITHOUT CONTRAST
TECHNIQUE: Multidetector CT imaging of the abdomen and pelvis was performed
following the standard protocol without IV contrast.

[Series 2: axial st · axial · 0.95mm/px · z∈[+652,+1187]mm · 13 of 117 slices shown, 15 images]
[im 5/117  soft-tissue]
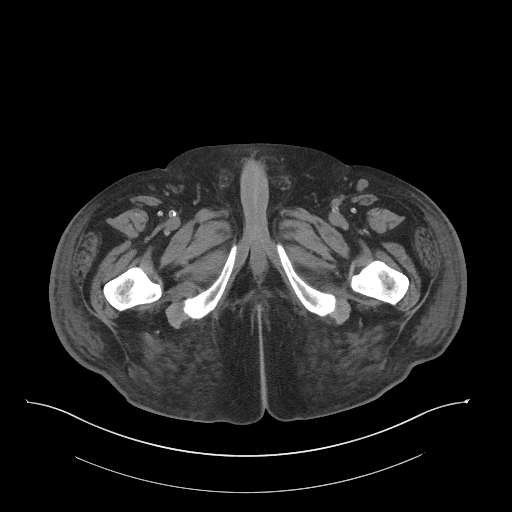
[im 5/117  bone]
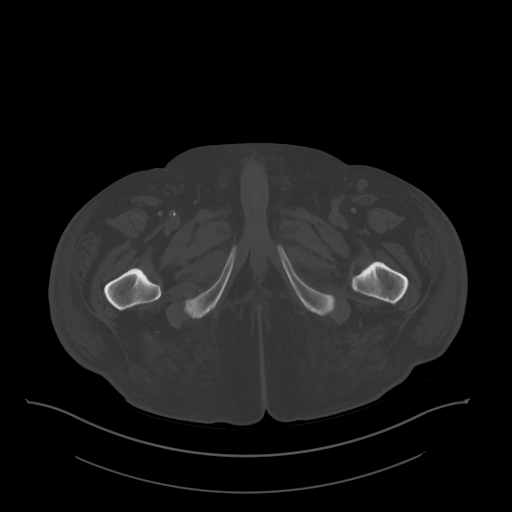
[im 14/117  soft-tissue]
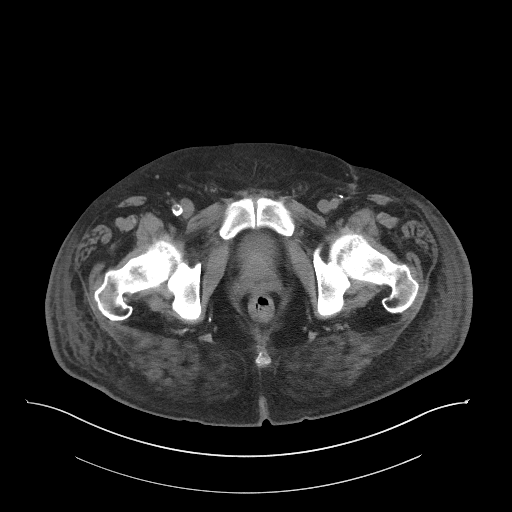
[im 24/117  soft-tissue]
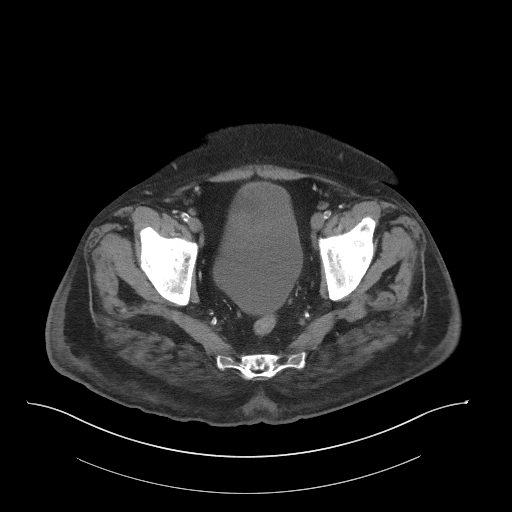
[im 33/117  soft-tissue]
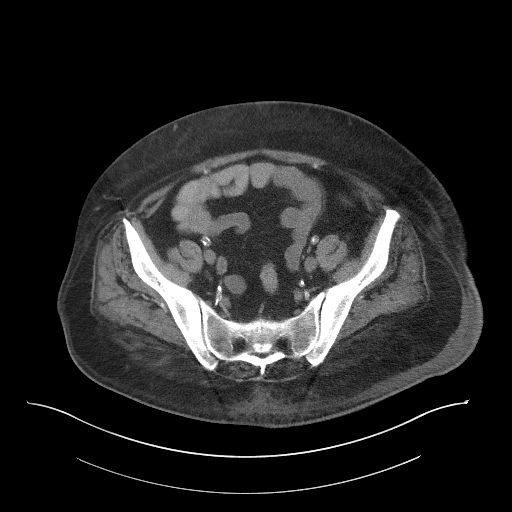
[im 42/117  soft-tissue]
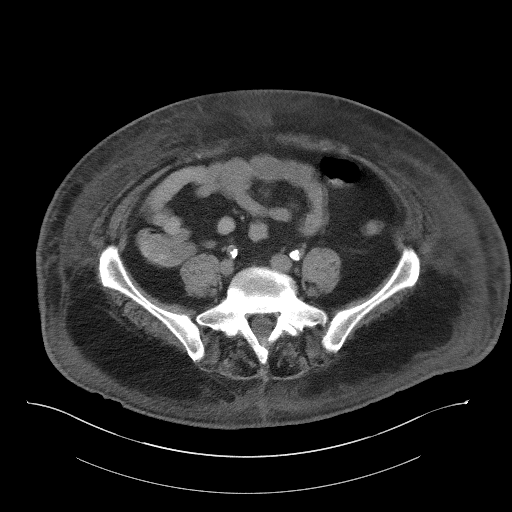
[im 52/117  soft-tissue]
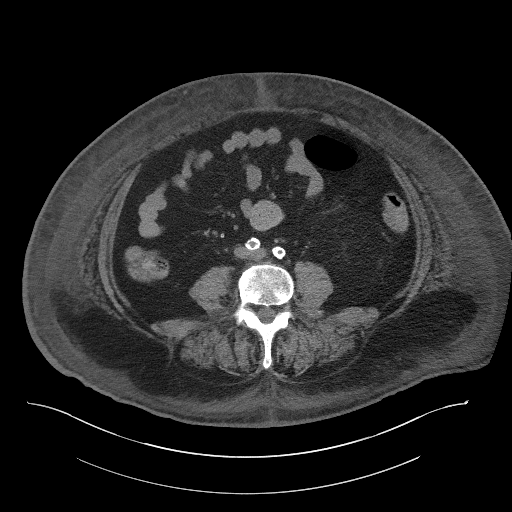
[im 61/117  soft-tissue]
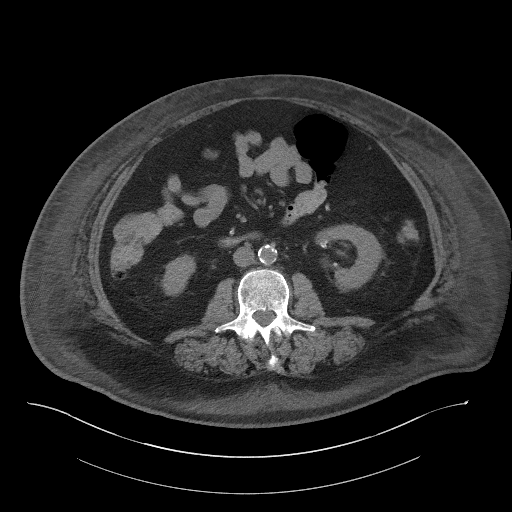
[im 65/117  soft-tissue]
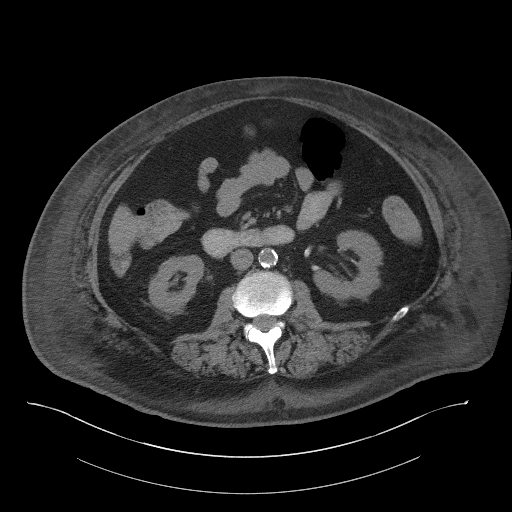
[im 75/117  soft-tissue]
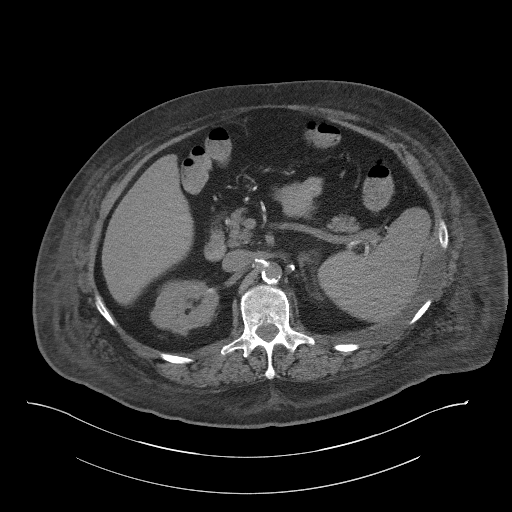
[im 75/117  bone]
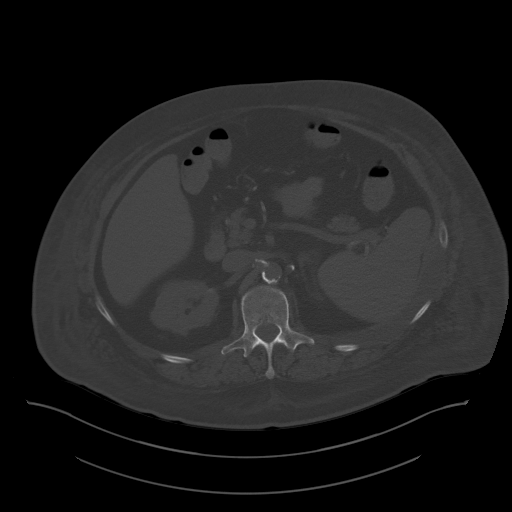
[im 84/117  soft-tissue]
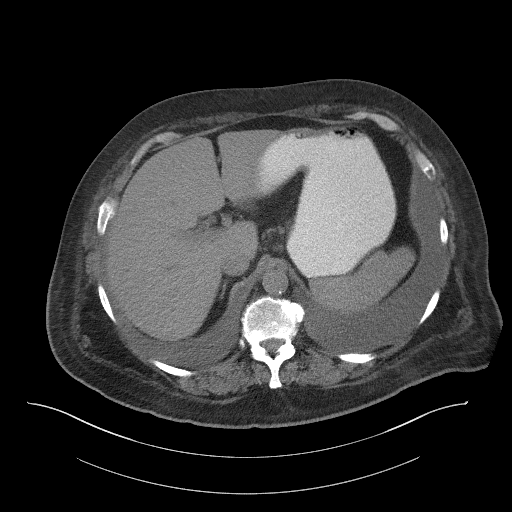
[im 93/117  soft-tissue]
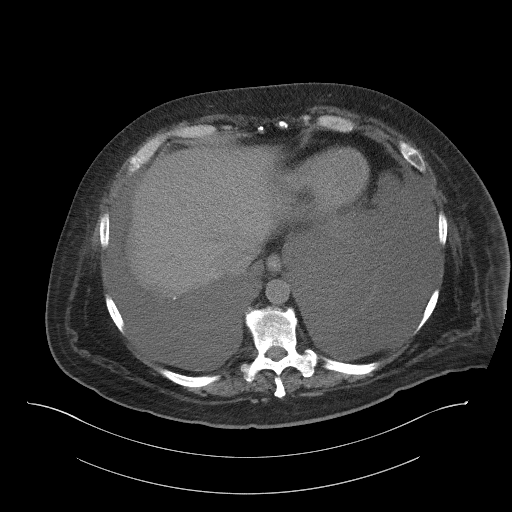
[im 103/117  soft-tissue]
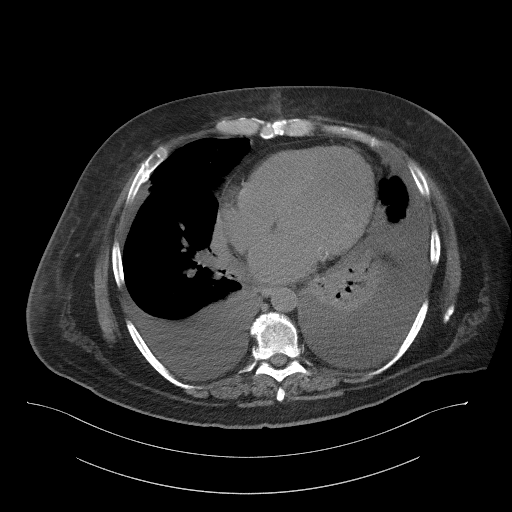
[im 112/117  soft-tissue]
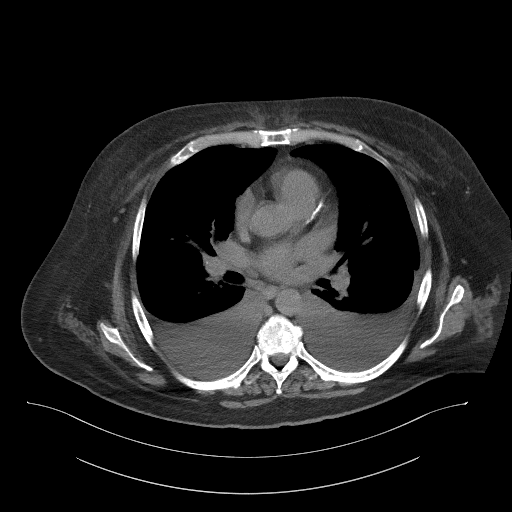

[Series 4: coronal st · coronal · 0.98mm/px · 3 of 119 slices shown]
[im 40/119  soft-tissue]
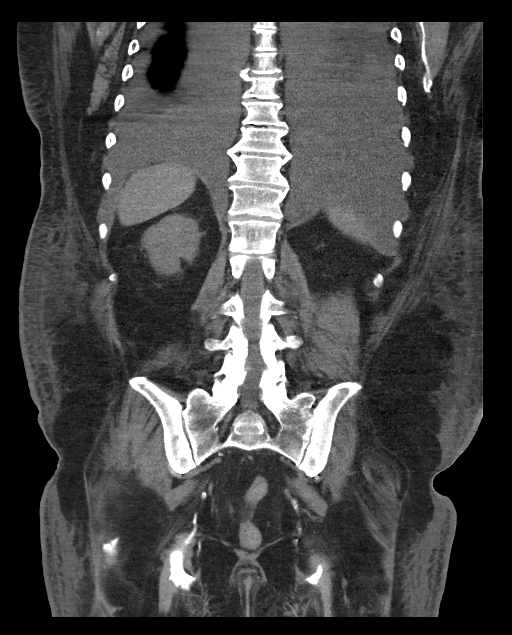
[im 53/119  soft-tissue]
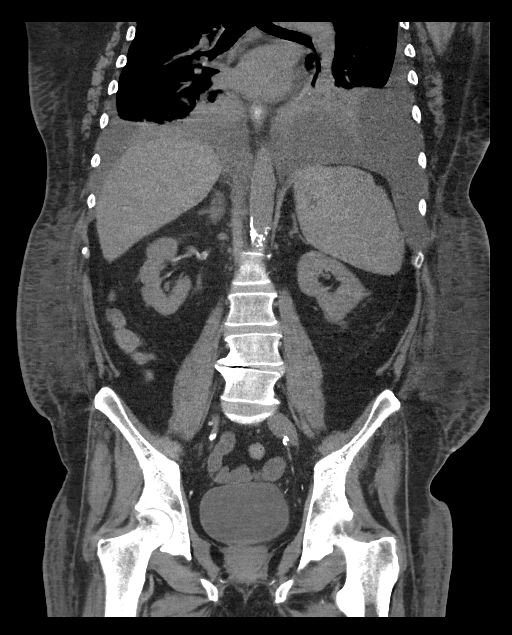
[im 66/119  soft-tissue]
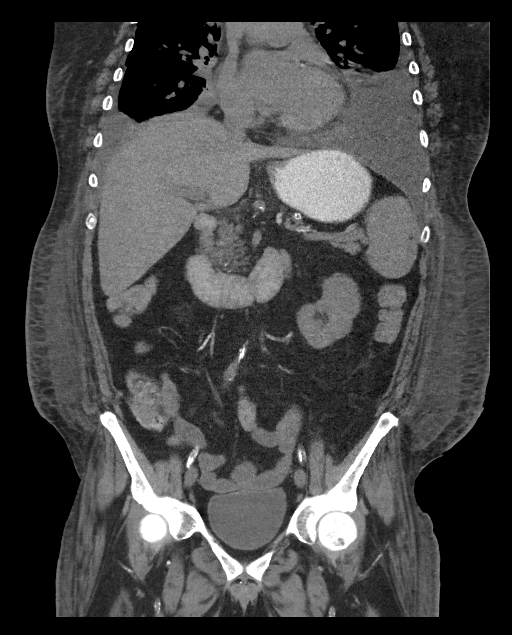

[16 of 46 positions shown; findings below may reference images not displayed]

FINDINGS: Lower chest: Large bilateral pleural effusions. Compressive
atelectasis in the lower lobes. Diffuse coronary artery
calcifications.

Hepatobiliary: No focal hepatic abnormality.  Prior cholecystectomy

Pancreas: No focal abnormality or ductal dilatation.

Spleen: No focal abnormality.  Normal size.

Adrenals/Urinary Tract: No adrenal abnormality. No focal renal
abnormality. No stones or hydronephrosis. Urinary bladder is
unremarkable. Extensive renovascular calcifications.

Stomach/Bowel: Normal appendix. Stomach, large and small bowel
grossly unremarkable.

Vascular/Lymphatic: Diffuse aortoiliac and branch vessel
calcifications. No evidence of aneurysm or adenopathy.

Reproductive: No visible focal abnormality.

Other: No free fluid or free air.

Musculoskeletal: No acute bony abnormality.
IMPRESSION: Large bilateral pleural effusions. Compressive atelectasis in the
lower lobes.

Age advanced diffuse coronary artery and aortic atherosclerosis.

No acute findings in the abdomen or pelvis.

## 2022-10-30 IMAGING — DX DG CHEST 1V PORT
1 series · 1 of 1 positions shown · non-contrast
Comparison: 12/16/2020

CLINICAL DATA: Shortness of breath.  COVID positive.

EXAM:
PORTABLE CHEST 1 VIEW

[chest ap]
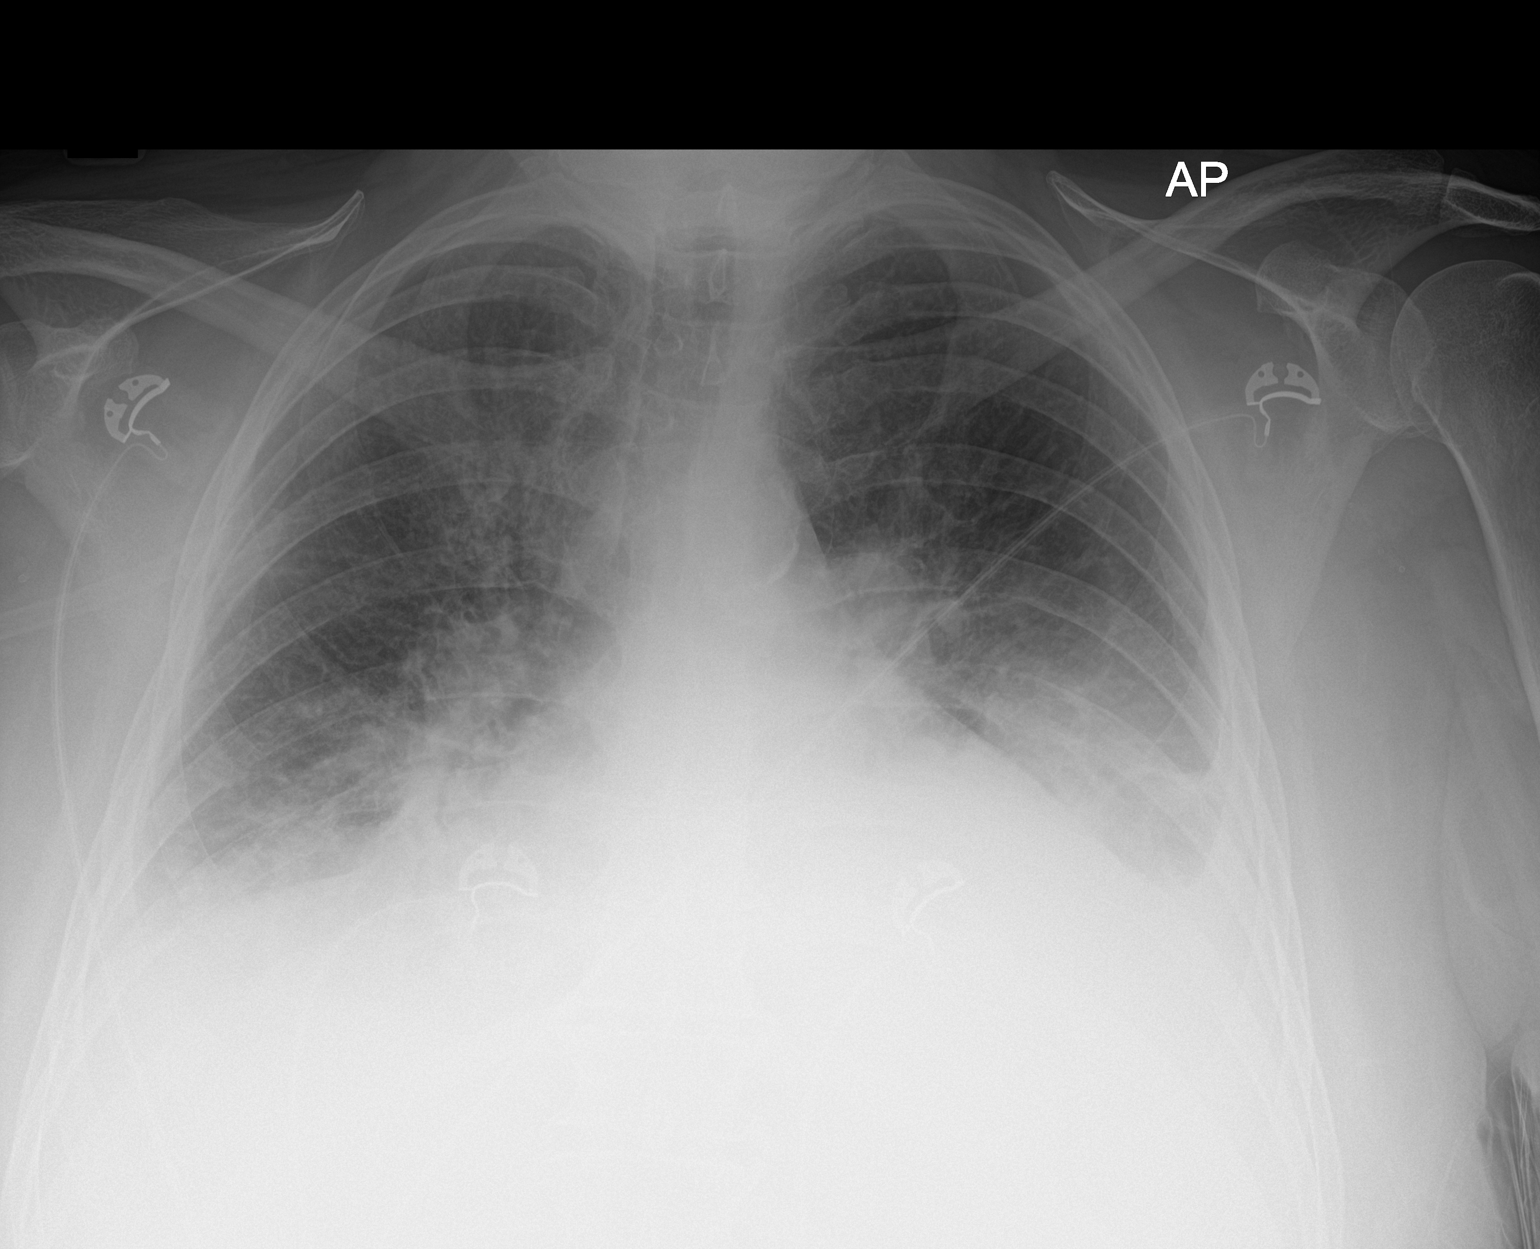

[1 of 1 positions shown; findings below may reference images not displayed]

FINDINGS: Stable to slightly increasing bilateral LOWER lung opacities noted.

Probable trace pleural effusions are noted.

Cardiomediastinal silhouette is unchanged.

No pneumothorax or acute bony abnormality.
IMPRESSION: Stable to slightly increasing bilateral LOWER lung opacities likely
representing a combination of pneumonia and atelectasis. Probable
trace pleural effusions.

## 2022-10-30 DEATH — deceased
